# Patient Record
Sex: Female | Born: 1960 | Race: White | Hispanic: No | Marital: Married | Smoking: Never smoker
Health system: Southern US, Community
[De-identification: ages and names within clinical notes are randomized; demographics above are authoritative.]

## PROBLEM LIST (undated history)

## (undated) DIAGNOSIS — H8109 Meniere's disease, unspecified ear: Secondary | ICD-10-CM

## (undated) HISTORY — DX: Meniere's disease, unspecified ear: H81.09

---

## 2005-01-13 ENCOUNTER — Encounter: Admission: RE | Admit: 2005-01-13 | Discharge: 2005-01-13 | Payer: Self-pay | Admitting: *Deleted

## 2005-02-11 ENCOUNTER — Other Ambulatory Visit: Admission: RE | Admit: 2005-02-11 | Discharge: 2005-02-11 | Payer: Self-pay | Admitting: Family Medicine

## 2005-04-02 ENCOUNTER — Ambulatory Visit (HOSPITAL_COMMUNITY): Admission: RE | Admit: 2005-04-02 | Discharge: 2005-04-02 | Payer: Self-pay | Admitting: Family Medicine

## 2006-05-12 ENCOUNTER — Ambulatory Visit (HOSPITAL_COMMUNITY): Admission: RE | Admit: 2006-05-12 | Discharge: 2006-05-12 | Payer: Self-pay | Admitting: Family Medicine

## 2006-09-26 ENCOUNTER — Other Ambulatory Visit: Admission: RE | Admit: 2006-09-26 | Discharge: 2006-09-26 | Payer: Self-pay | Admitting: Family Medicine

## 2007-05-19 ENCOUNTER — Ambulatory Visit (HOSPITAL_COMMUNITY): Admission: RE | Admit: 2007-05-19 | Discharge: 2007-05-19 | Payer: Self-pay | Admitting: Family Medicine

## 2007-10-09 ENCOUNTER — Other Ambulatory Visit: Admission: RE | Admit: 2007-10-09 | Discharge: 2007-10-09 | Payer: Self-pay | Admitting: Family Medicine

## 2008-08-17 ENCOUNTER — Emergency Department (HOSPITAL_BASED_OUTPATIENT_CLINIC_OR_DEPARTMENT_OTHER): Admission: EM | Admit: 2008-08-17 | Discharge: 2008-08-17 | Payer: Self-pay | Admitting: Emergency Medicine

## 2009-03-31 ENCOUNTER — Ambulatory Visit (HOSPITAL_COMMUNITY): Admission: RE | Admit: 2009-03-31 | Discharge: 2009-03-31 | Payer: Self-pay | Admitting: Family Medicine

## 2009-04-11 ENCOUNTER — Other Ambulatory Visit: Admission: RE | Admit: 2009-04-11 | Discharge: 2009-04-11 | Payer: Self-pay | Admitting: Family Medicine

## 2010-04-12 ENCOUNTER — Ambulatory Visit (HOSPITAL_COMMUNITY): Admission: RE | Admit: 2010-04-12 | Discharge: 2010-04-12 | Payer: Self-pay | Admitting: Family Medicine

## 2010-10-28 ENCOUNTER — Encounter: Payer: Self-pay | Admitting: Family Medicine

## 2011-04-04 ENCOUNTER — Other Ambulatory Visit (HOSPITAL_COMMUNITY): Payer: Self-pay | Admitting: Family Medicine

## 2011-04-04 DIAGNOSIS — Z1231 Encounter for screening mammogram for malignant neoplasm of breast: Secondary | ICD-10-CM

## 2011-04-22 ENCOUNTER — Ambulatory Visit (HOSPITAL_COMMUNITY): Payer: BC Managed Care – PPO

## 2011-04-26 ENCOUNTER — Ambulatory Visit (HOSPITAL_COMMUNITY)
Admission: RE | Admit: 2011-04-26 | Discharge: 2011-04-26 | Disposition: A | Discharge status: Home / Self Care | Payer: BC Managed Care – PPO | Source: Ambulatory Visit | Attending: Family Medicine | Admitting: Family Medicine

## 2011-04-26 DIAGNOSIS — Z1231 Encounter for screening mammogram for malignant neoplasm of breast: Secondary | ICD-10-CM

## 2011-07-09 LAB — CBC
HCT: 40.8
MCHC: 34.4
Platelets: 181
RDW: 12.3

## 2011-07-09 LAB — DIFFERENTIAL
Basophils Relative: 1
Lymphocytes Relative: 34

## 2011-07-09 LAB — COMPREHENSIVE METABOLIC PANEL
AST: 32
Albumin: 4.9
Alkaline Phosphatase: 63
BUN: 9
CO2: 28
Calcium: 9.8
Chloride: 102
Creatinine, Ser: 0.7
Glucose, Bld: 117 — ABNORMAL HIGH
Total Bilirubin: 0.7

## 2011-07-09 LAB — D-DIMER, QUANTITATIVE: D-Dimer, Quant: 0.26

## 2011-07-09 LAB — POCT CARDIAC MARKERS
CKMB, poc: <1 — ABNORMAL LOW
CKMB, poc: <1 — ABNORMAL LOW
Myoglobin, poc: 59.5
Troponin i, poc: <0.05
Troponin i, poc: <0.05

## 2012-05-04 ENCOUNTER — Other Ambulatory Visit (HOSPITAL_COMMUNITY): Payer: Self-pay | Admitting: Family Medicine

## 2012-05-04 DIAGNOSIS — Z1231 Encounter for screening mammogram for malignant neoplasm of breast: Secondary | ICD-10-CM

## 2012-05-22 ENCOUNTER — Ambulatory Visit (HOSPITAL_COMMUNITY): Payer: BC Managed Care – PPO

## 2013-09-16 ENCOUNTER — Other Ambulatory Visit (HOSPITAL_COMMUNITY): Payer: Self-pay | Admitting: Family Medicine

## 2013-09-16 DIAGNOSIS — Z1231 Encounter for screening mammogram for malignant neoplasm of breast: Secondary | ICD-10-CM

## 2013-10-06 ENCOUNTER — Ambulatory Visit (HOSPITAL_COMMUNITY): Payer: BC Managed Care – PPO

## 2013-10-21 ENCOUNTER — Ambulatory Visit (HOSPITAL_COMMUNITY): Payer: BC Managed Care – PPO

## 2013-12-21 DIAGNOSIS — H903 Sensorineural hearing loss, bilateral: Secondary | ICD-10-CM | POA: Insufficient documentation

## 2013-12-21 DIAGNOSIS — H8109 Meniere's disease, unspecified ear: Secondary | ICD-10-CM | POA: Insufficient documentation

## 2014-02-16 ENCOUNTER — Ambulatory Visit (HOSPITAL_COMMUNITY): Payer: BC Managed Care – PPO

## 2014-03-29 ENCOUNTER — Ambulatory Visit (HOSPITAL_COMMUNITY)
Admission: RE | Admit: 2014-03-29 | Discharge: 2014-03-29 | Disposition: A | Discharge status: Home / Self Care | Payer: BC Managed Care – PPO | Source: Ambulatory Visit | Attending: Family Medicine | Admitting: Family Medicine

## 2014-03-29 ENCOUNTER — Other Ambulatory Visit (HOSPITAL_COMMUNITY): Payer: Self-pay | Admitting: Family Medicine

## 2014-03-29 DIAGNOSIS — Z1231 Encounter for screening mammogram for malignant neoplasm of breast: Secondary | ICD-10-CM

## 2015-05-22 ENCOUNTER — Other Ambulatory Visit (HOSPITAL_COMMUNITY): Payer: Self-pay | Admitting: Diagnostic Radiology

## 2015-05-22 DIAGNOSIS — Z1231 Encounter for screening mammogram for malignant neoplasm of breast: Secondary | ICD-10-CM

## 2015-05-25 ENCOUNTER — Ambulatory Visit (HOSPITAL_COMMUNITY): Payer: BC Managed Care – PPO

## 2015-06-09 ENCOUNTER — Ambulatory Visit (HOSPITAL_COMMUNITY): Payer: BC Managed Care – PPO

## 2016-04-04 ENCOUNTER — Other Ambulatory Visit: Payer: Self-pay | Admitting: Family Medicine

## 2016-04-04 ENCOUNTER — Ambulatory Visit
Admission: RE | Admit: 2016-04-04 | Discharge: 2016-04-04 | Disposition: A | Discharge status: Home / Self Care | Payer: BC Managed Care – PPO | Source: Ambulatory Visit | Attending: Diagnostic Radiology | Admitting: Diagnostic Radiology

## 2016-04-04 DIAGNOSIS — Z1231 Encounter for screening mammogram for malignant neoplasm of breast: Secondary | ICD-10-CM

## 2016-05-20 DIAGNOSIS — R430 Anosmia: Secondary | ICD-10-CM | POA: Insufficient documentation

## 2017-04-03 ENCOUNTER — Other Ambulatory Visit: Payer: Self-pay | Admitting: Family Medicine

## 2017-04-03 DIAGNOSIS — Z1231 Encounter for screening mammogram for malignant neoplasm of breast: Secondary | ICD-10-CM

## 2017-04-29 ENCOUNTER — Ambulatory Visit
Admission: RE | Admit: 2017-04-29 | Discharge: 2017-04-29 | Disposition: A | Discharge status: Home / Self Care | Payer: BC Managed Care – PPO | Source: Ambulatory Visit | Attending: Family Medicine | Admitting: Family Medicine

## 2017-04-29 DIAGNOSIS — Z1231 Encounter for screening mammogram for malignant neoplasm of breast: Secondary | ICD-10-CM

## 2018-03-31 ENCOUNTER — Other Ambulatory Visit: Payer: Self-pay | Admitting: Family Medicine

## 2018-03-31 DIAGNOSIS — Z1231 Encounter for screening mammogram for malignant neoplasm of breast: Secondary | ICD-10-CM

## 2018-05-04 ENCOUNTER — Ambulatory Visit: Payer: BC Managed Care – PPO

## 2018-05-25 ENCOUNTER — Ambulatory Visit: Payer: BC Managed Care – PPO

## 2018-06-23 ENCOUNTER — Ambulatory Visit
Admission: RE | Admit: 2018-06-23 | Discharge: 2018-06-23 | Disposition: A | Discharge status: Home / Self Care | Payer: BC Managed Care – PPO | Source: Ambulatory Visit | Attending: Family Medicine | Admitting: Family Medicine

## 2018-06-23 DIAGNOSIS — Z1231 Encounter for screening mammogram for malignant neoplasm of breast: Secondary | ICD-10-CM

## 2019-06-06 ENCOUNTER — Other Ambulatory Visit: Payer: Self-pay

## 2019-06-06 ENCOUNTER — Emergency Department (INDEPENDENT_AMBULATORY_CARE_PROVIDER_SITE_OTHER)
Admission: EM | Admit: 2019-06-06 | Discharge: 2019-06-06 | Disposition: A | Discharge status: Home / Self Care | Payer: BC Managed Care – PPO | Source: Home / Self Care | Attending: Family Medicine | Admitting: Family Medicine

## 2019-06-06 ENCOUNTER — Emergency Department (INDEPENDENT_AMBULATORY_CARE_PROVIDER_SITE_OTHER): Payer: BC Managed Care – PPO

## 2019-06-06 DIAGNOSIS — M79671 Pain in right foot: Secondary | ICD-10-CM

## 2019-06-06 DIAGNOSIS — S93509A Unspecified sprain of unspecified toe(s), initial encounter: Secondary | ICD-10-CM

## 2019-06-06 NOTE — ED Provider Notes (Signed)
[facility]    CSN: [id number] Arrival date & time: 06/06/19  1359      History   Chief Complaint Chief Complaint  Patient presents with  . Toe Pain    Injury; RT second toe    HPI Maat B Ivins is a 58 y.o. female.   Patient tripped over her "flip flop" last night, injuring her right second toe.  She has pain/swelling at the tip of her toe and pain when walking.  The history is provided by the patient.  Toe Pain This is a new problem. The current episode started yesterday. The problem occurs constantly. The problem has not changed since onset.The symptoms are aggravated by walking. Nothing relieves the symptoms. She has tried nothing for the symptoms.    History reviewed. No pertinent past medical history.  There are no active problems to display for this patient.   History reviewed. No pertinent surgical history.  OB History   No obstetric history on file.      Home Medications    Prior to Admission medications   Medication Sig Start Date End Date Taking? Authorizing Provider  sertraline (ZOLOFT) 100 MG tablet TK 1 T QD 05/20/19   [provider]    Family History Family History  Problem Relation Age of Onset  . Breast cancer Sister   . Breast cancer Maternal Grandmother     Social History Social History   Tobacco Use  . Smoking status: Never Smoker  . Smokeless tobacco: Never Used  Substance Use Topics  . Alcohol use: Never    Frequency: Never  . Drug use: Not on file     Allergies   Patient has no known allergies.   Review of Systems Review of Systems  Musculoskeletal:       Right second toe pain  All other systems reviewed and are negative.    Physical Exam Triage Vital Signs ED Triage Vitals  Enc Vitals Group     BP 06/06/19 1413 (!) 160/102     Pulse Rate 06/06/19 1413 94     Resp 06/06/19 1413 18     Temp 06/06/19 1413 98.2 F (36.8 C)     Temp Source 06/06/19 1413 Oral     SpO2 06/06/19 1413 97 %     Weight 06/06/19 1414 150 lb (68 kg)     Height 06/06/19 1414 5\' 2"  (1.575 m)     Head Circumference --      Peak Flow --      Pain Score 06/06/19 1414 7     Pain Loc --      Pain Edu? --      Excl. in [place]? --    No data found.  Updated Vital Signs BP (!) 160/102 (BP Location: Right Arm)   Pulse 94   Temp 98.2 F (36.8 C) (Oral)   Resp 18   Ht 5\' 2"  (1.575 m)   Wt 68 kg   SpO2 97%   BMI 27.44 kg/m   Visual Acuity Right Eye Distance:   Left Eye Distance:   Bilateral Distance:    Right Eye Near:   Left Eye Near:    Bilateral Near:     Physical Exam Vitals signs reviewed.  Constitutional:      General: She is not in acute distress. HENT:     Head: Atraumatic.  Eyes:     Pupils: Pupils are equal, round, and reactive to light.  Cardiovascular:     Rate  and Rhythm: Normal rate.  Pulmonary:     Effort: Pulmonary effort is normal.  Musculoskeletal:     Right foot: Decreased range of motion. Tenderness, bony tenderness and swelling present.       Feet:     Comments: Tenderness, swelling, ecchymosis distal phalanx right second toe.  Skin:    General: Skin is warm and dry.  Neurological:     Mental Status: She is alert.      UC Treatments / Results  Labs (all labs ordered are listed, but only abnormal results are displayed) Labs Reviewed - No data to display  EKG   Radiology Dg Foot Complete Right  Result Date: 06/06/2019 CLINICAL DATA:  Second toe injury after tripping last night. EXAM: RIGHT FOOT COMPLETE - 3+ VIEW COMPARISON:  None. FINDINGS: There is no evidence of fracture or dislocation. There is no evidence of arthropathy or other focal bone abnormality. Soft tissues are unremarkable. IMPRESSION: Negative. Electronically Signed   By: Obie DredgeWilliam T Derry M.D.   On: 06/06/2019 15:09    Procedures Procedures (including critical care time)  Medications Ordered in UC Medications - No data to display  Initial Impression / Assessment and Plan / UC Course   I have reviewed the triage vital signs and the nursing notes.  Pertinent labs & imaging results that were available during my care of the patient were reviewed by me and considered in my medical decision making (see chart for details).     Toe strapped using "Buddy Tape" technique.  Followup with Dr. Rodney Langtonhomas Thekkekandam or Dr. Clementeen GrahamEvan Corey ([facility]) if not improving about two weeks.    Final Clinical Impressions(s) / UC Diagnoses   Final diagnoses:  Toe sprain, initial encounter     Discharge Instructions     "Buddy Tape" toes.  Apply ice pack for 10 to 15 minutes, 3 to 4 times daily  Continue until pain and swelling decrease.  Wear comfortable open toe sandals. May take ibuprofen as needed.    ED Prescriptions    None        Lattie HawBeese,  A, MD 06/07/19 [phone]

## 2019-06-06 NOTE — Discharge Instructions (Addendum)
"  Buddy Tape" toes.  Apply ice pack for 10 to 15 minutes, 3 to 4 times daily  Continue until pain and swelling decrease.  Wear comfortable open toe sandals. May take ibuprofen as needed.

## 2019-06-06 NOTE — ED Triage Notes (Signed)
Pt c/o toe pain (RT second toe). She states she injured it when she tripped over her flip flop last night. Bruising noted. Pain 7/10. No OTC pain meds taken today.

## 2020-03-23 ENCOUNTER — Other Ambulatory Visit: Payer: Self-pay | Admitting: Family Medicine

## 2020-03-23 DIAGNOSIS — Z1231 Encounter for screening mammogram for malignant neoplasm of breast: Secondary | ICD-10-CM

## 2020-03-29 ENCOUNTER — Ambulatory Visit
Admission: RE | Admit: 2020-03-29 | Discharge: 2020-03-29 | Disposition: A | Discharge status: Home / Self Care | Payer: BC Managed Care – PPO | Source: Ambulatory Visit | Attending: Family Medicine | Admitting: Family Medicine

## 2020-03-29 ENCOUNTER — Other Ambulatory Visit: Payer: Self-pay

## 2020-03-29 DIAGNOSIS — Z1231 Encounter for screening mammogram for malignant neoplasm of breast: Secondary | ICD-10-CM

## 2020-03-30 ENCOUNTER — Other Ambulatory Visit: Payer: Self-pay | Admitting: *Deleted

## 2020-04-15 NOTE — Progress Notes (Signed)
Cardiology Office Note:    Date:  04/17/2020   ID:  Lori Sparks, DOB [birth year], MRN [id number]  PCP:  Joycelyn Rua, MD  Cardiologist:  Norman Herrlich, MD   Referring MD: Joycelyn Rua, MD  ASSESSMENT:    1. Palpitations   2. Chest pain of uncertain etiology   3. Essential hypertension    PLAN:    In order of problems listed above:  1. Her palpitation is progressive quite suggestive of atrial fibrillation and after discussion of options we will arm her with a live ZIO AT monitor and hopefully document atrial fibrillation.  If documented she would require anticoagulation with female sex and hypertension and with the severity of her symptoms I would give her the option of antiarrhythmic drug flecainide or referral for EP catheter ablation. 2. She is having symptoms best describe his atypical angina the question is whether it is due to arrhythmia or a second cardiac problem with her significant hyperlipidemia and should undergo cardiac CTA continue beta-blocker.  If she had high risk markers we need to consider the merits of coronary angiography. 3. Continue her beta-blocker if she requires a second drug ideas low-dose ARB 4. Initiate lipid-lowering treatment with a high intensity statin  Next appointment in 6 weeks   Medication Adjustments/Labs and Tests Ordered: Current medicines are reviewed at length with the patient today.  Concerns regarding medicines are outlined above.  Orders Placed This Encounter  Procedures  . CT CORONARY MORPH W/CTA COR W/SCORE W/CA W/CM &/OR WO/CM  . CT CORONARY FRACTIONAL FLOW RESERVE DATA PREP  . CT CORONARY FRACTIONAL FLOW RESERVE FLUID ANALYSIS  . Basic metabolic panel  . LONG TERM MONITOR-LIVE TELEMETRY (3-14 DAYS)  . EKG 12-Lead   Meds ordered this encounter  Medications  . rosuvastatin (CRESTOR) 10 MG tablet    Sig: Take 1 tablet (10 mg total) by mouth daily.    Dispense:  90 tablet    Refill:  3     Chief Complaint    Patient presents with  . Palpitations  . Chest Pain    History of Present Illness:    Lori Sparks is a 59 y.o. female who is being seen today for the evaluation of palpitation and chest pain at the request of Joycelyn Rua, MD. 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  May the symptoms markedly progressed and she started to awaken at night with her heart racing and it would last at times for hours.  In the last month and now occurs during the daytime and it tends to be persistent.  She is also noticed in the last month that she gets episodes of chest discomfort.  She describes it as tightness substernal without activity but at times relieved with rest and associated with her palpitation.  There is no radiation.  And the symptoms are for been brief less than 10 to 15 minutes.  She has had some shortness of breath when she tries to do exercise.  Her family history is noteworthy for atrial fibrillation in her mother in her 30s and CAD in her father MI 62 year old and a sister who has had stroke in her 37s.  She has no known history of congenital rheumatic heart disease.  Unfortunately she has not documented any episodes to measure her heart rate.  In the last year she has had multiple  determinations of elevated blood pressure recently started on a beta-blocker and has had no further chest pain since then and has less severe palpitation.  She also has quite significant dyslipidemia with an LDL cholesterol of 172 total 308 HDL 129 A1c not measured hemoglobin 13.4 TSH normal.  Past Medical History:  Diagnosis Date  . Meniere disease     History reviewed. No pertinent surgical history.  Current Medications: Current Meds  Medication Sig  . ALPRAZolam (XANAX) 0.5 MG tablet Take 0.5 mg by mouth 3 (three) times daily as needed.  .  metoprolol succinate (TOPROL-XL) 25 MG 24 hr tablet Take 25 mg by mouth daily.     Allergies:   Patient has no known allergies.   Social History   Socioeconomic History  . Marital status: Married    Spouse name: Not on file  . Number of children: Not on file  . Years of education: Not on file  . Highest education level: Not on file  Occupational History  . Occupation: Runner, broadcasting/film/video  Tobacco Use  . Smoking status: Never Smoker  . Smokeless tobacco: Never Used  Vaping Use  . Vaping Use: Never used  Substance and Sexual Activity  . Alcohol use: Never  . Drug use: Not on file  . Sexual activity: Not on file  Other Topics Concern  . Not on file  Social History Narrative  . Not on file   Social Determinants of Health   Financial Resource Strain:   . Difficulty of Paying Living Expenses:   Food Insecurity:   . Worried About Programme researcher, broadcasting/film/video in the Last Year:   . Barista in the Last Year:   Transportation Needs:   . Freight forwarder (Medical):   Marland Kitchen Lack of Transportation (Non-Medical):   Physical Activity:   . Days of Exercise per Week:   . Minutes of Exercise per Session:   Stress:   . Feeling of Stress :   Social Connections:   . Frequency of Communication with Friends and Family:   . Frequency of Social Gatherings with Friends and Family:   . Attends Religious Services:   . Active Member of Clubs or Organizations:   . Attends Banker Meetings:   Marland Kitchen Marital Status:      Family History: The patient's family history includes Breast cancer in her maternal grandmother and sister.  ROS:   Review of Systems  Constitutional: Negative.  HENT: Negative.   Eyes: Negative.   Cardiovascular: Positive for chest pain and palpitations.  Respiratory: Positive for shortness of breath.   Endocrine: Negative.   Hematologic/Lymphatic: Negative.   Skin: Negative.   Musculoskeletal: Negative.   Gastrointestinal: Negative.   Genitourinary: Negative.     Neurological: Negative.   Psychiatric/Behavioral: Negative.   Allergic/Immunologic: Negative.    Please see the history of present illness.     All other systems reviewed and are negative.  EKGs/Labs/Other Studies Reviewed:    The following studies were reviewed today:   EKG:  EKG is  ordered today.  The ekg ordered today is personally reviewed and demonstrates sinus rhythm normal    Physical Exam:    VS:  BP (!) 152/86   Pulse 84   Ht 5\' 2"  (1.575 m)   Wt 148 lb (67.1 kg)   SpO2 98%   BMI 27.07 kg/m     Wt Readings from Last 3 Encounters:  04/17/20 148 lb (67.1 kg)  06/06/19 150 lb (68 kg)  GEN:  Well nourished, well developed in no acute distress HEENT: Normal NECK: No JVD; No carotid bruits LYMPHATICS: No lymphadenopathy CARDIAC: RRR, no murmurs, rubs, gallops RESPIRATORY:  Clear to auscultation without rales, wheezing or rhonchi  ABDOMEN: Soft, non-tender, non-distended MUSCULOSKELETAL:  No edema; No deformity  SKIN: Warm and dry NEUROLOGIC:  Alert and oriented x 3 PSYCHIATRIC:  Normal affect     Signed, Norman Herrlich, MD  04/17/2020 12:02 PM    [facility]

## 2020-04-17 ENCOUNTER — Telehealth: Payer: Self-pay | Admitting: Cardiology

## 2020-04-17 ENCOUNTER — Ambulatory Visit (INDEPENDENT_AMBULATORY_CARE_PROVIDER_SITE_OTHER): Payer: BC Managed Care – PPO | Admitting: Cardiology

## 2020-04-17 ENCOUNTER — Other Ambulatory Visit: Payer: Self-pay

## 2020-04-17 ENCOUNTER — Ambulatory Visit: Payer: BC Managed Care – PPO

## 2020-04-17 ENCOUNTER — Ambulatory Visit (INDEPENDENT_AMBULATORY_CARE_PROVIDER_SITE_OTHER): Payer: BC Managed Care – PPO

## 2020-04-17 ENCOUNTER — Encounter: Payer: Self-pay | Admitting: Cardiology

## 2020-04-17 VITALS — BP 152/86 | HR 84 | Ht 62.0 in | Wt 148.0 lb

## 2020-04-17 DIAGNOSIS — R002 Palpitations: Secondary | ICD-10-CM

## 2020-04-17 DIAGNOSIS — E7800 Pure hypercholesterolemia, unspecified: Secondary | ICD-10-CM | POA: Diagnosis not present

## 2020-04-17 DIAGNOSIS — I10 Essential (primary) hypertension: Secondary | ICD-10-CM

## 2020-04-17 DIAGNOSIS — R079 Chest pain, unspecified: Secondary | ICD-10-CM

## 2020-04-17 MED ORDER — ROSUVASTATIN CALCIUM 10 MG PO TABS: 10.0000 mg | ORAL_TABLET | Freq: Every day | ORAL | 3 refills | Status: DC

## 2020-04-17 NOTE — Telephone Encounter (Signed)
Patient states she has additional questions in regards to heart monitor discussed during appointment on today, 04/17/20 with Dr. Dulce Sellar. Please call.

## 2020-04-17 NOTE — Telephone Encounter (Signed)
Spoke to the patient just now and answered her questions in regards to her Live zio monitor. She was wondering if Dr. Dulce Sellar could see the monitor reading at all times. I let her know that Dr. Dulce Sellar would be notified if there were any kind of alarming events but he would not be reading the strip at all times. She verbalizes understanding and thanks me for the call back.   Encouraged patient to call back with any questions or concerns.

## 2020-04-17 NOTE — Patient Instructions (Addendum)
Medication Instructions:  Your physician has recommended you make the following change in your medication:  START: Rosuvastatin 10 mg take one tablet by mouth daily.  *If you need a refill on your cardiac medications before your next appointment, please call your pharmacy*   Lab Work: Your physician recommends that you return for lab work in: Within one week of your cardiac CT If you have labs (blood work) drawn today and your tests are completely normal, you will receive your results only by: Marland Kitchen MyChart Message (if you have MyChart) OR . A paper copy in the mail If you have any lab test that is abnormal or we need to change your treatment, we will call you to review the results.   Testing/Procedures: A zio monitor was ordered today. It will remain on for 30 days. You will then return monitor and event diary in provided box. It takes 1-2 weeks for report to be downloaded and returned to [place]. We will call you with the results. If monitor falls off or has orange flashing light, please call Zio for further instructions.   Your cardiac CT will be scheduled at the below location:   [facility] [address] [place], [place] [address] [phone]  If scheduled at [facility], please arrive at the [facility] main entrance of [facility] 30 minutes prior to test start time. Proceed to the [facility] Radiology Department (first floor) to check-in and test prep.  Please follow these instructions carefully (unless otherwise directed):  On the Night Before the Test: . Be sure to Drink plenty of water. . Do not consume any caffeinated/decaffeinated beverages or chocolate 12 hours prior to your test. . Do not take any antihistamines 12 hours prior to your test.  On the Day of the Test: . Drink plenty of water. Do not drink any water within one hour of the test. . Do not eat any food 4 hours prior to the test. . You may take your regular medications prior to the  test.  . Take metoprolol (Lopressor) two hours prior to test. . FEMALES- please wear underwire-free bra if available       After the Test: . Drink plenty of water. . After receiving IV contrast, you may experience a mild flushed feeling. This is normal. . On occasion, you may experience a mild rash up to 24 hours after the test. This is not dangerous. If this occurs, you can take Benadryl 25 mg and increase your fluid intake. . If you experience trouble breathing, this can be serious. If it is severe call 911 IMMEDIATELY. If it is mild, please call our office. . If you take any of these medications: Glipizide/Metformin, Avandament, Glucavance, please do not take 48 hours after completing test unless otherwise instructed.   Once we have confirmed authorization from your insurance company, we will call you to set up a date and time for your test. Based on how quickly your insurance processes prior authorizations requests, please allow up to 4 weeks to be contacted for scheduling your Cardiac CT appointment. Be advised that routine Cardiac CT appointments could be scheduled as many as 8 weeks after your provider has ordered it.  For non-scheduling related questions, please contact the cardiac imaging nurse navigator should you have any questions/concerns: Marchia Bond, Cardiac Imaging Nurse Navigator Burley Saver, Interim Cardiac Imaging Nurse [place] [facility] Direct Office Dial: [phone]   For scheduling needs, including cancellations and rescheduling,  please call Vivien Rota at [phone], option 3.        Follow-Up: At [facility], you and your health needs are our priority.  As part of our continuing mission to provide you with exceptional heart care, we have created designated Provider Care Teams.  These Care Teams include your primary Cardiologist (physician) and Advanced Practice Providers (APPs -  Physician Assistants and Nurse Practitioners) who all  work together to provide you with the care you need, when you need it.  We recommend signing up for the patient portal called "MyChart".  Sign up information is provided on this After Visit Summary.  MyChart is used to connect with patients for Virtual Visits (Telemedicine).  Patients are able to view lab/test results, encounter notes, upcoming appointments, etc.  Non-urgent messages can be sent to your provider as well.   To learn more about what you can do with MyChart, go to [email or web address].    Your next appointment:   6 week(s)  The format for your next appointment:   In Person  Provider:   Shirlee More, MD   Other Instructions

## 2020-04-25 ENCOUNTER — Other Ambulatory Visit: Payer: Self-pay | Admitting: Cardiology

## 2020-04-25 MED ORDER — METOPROLOL SUCCINATE ER 25 MG PO TB24: 25.0000 mg | ORAL_TABLET | Freq: Every day | ORAL | 3 refills | Status: DC

## 2020-04-25 NOTE — Telephone Encounter (Signed)
*  STAT* If patient is at the pharmacy, call can be transferred to refill team.   1. Which medications need to be refilled? (please list name of each medication and dose if known)   metoprolol succinate (TOPROL-XL) 25 MG 24 hr tablet     2. Which pharmacy/location (including street and city if local pharmacy) is medication to be sent to? [id number] - [place], [place] - [address]  3. Do they need a 30 day or 90 day supply? 90 day supply   

## 2020-04-25 NOTE — Telephone Encounter (Signed)
Refill sent in per request.  

## 2020-05-22 ENCOUNTER — Telehealth: Payer: Self-pay

## 2020-05-22 NOTE — Telephone Encounter (Signed)
Left message on patients voicemail to please return our call.   

## 2020-05-22 NOTE — Telephone Encounter (Signed)
-----   Message from Baldo Daub, MD sent at 05/20/2020 12:49 PM EDT ----- Normal or stable result  Monitor is good the episode she is aware of her heart are not related to an irregular heartbeat and this is good results

## 2020-06-18 NOTE — Progress Notes (Signed)
Cardiology Office Note:    Date:  06/19/2020   ID:  Lori Sparks, DOB 09-04-1961, MRN [id number]  PCP:  Lori Rua, MD  Cardiologist:  Lori Herrlich, MD    Referring MD: Lori Rua, MD    ASSESSMENT:    1. Palpitations   2. Chest pain of uncertain etiology   3. Essential hypertension   4. Pure hypercholesterolemia   5. Educated about COVID-19 virus infection    PLAN:    In order of problems listed above:  1. Resolved continue beta-blocker and if additional therapy is needed for hypertension we could transition to Bystolic. 2. I do not know why her CTA was not scheduled but will be performed in view of her chest pain elevators lipids 3. Initiate her statin she need a lipid profile next visit 4. She had COVID-19 previously strongly encouraged her to accept vaccine.  It appears if 1 dose is adequate in people with previous clinical COVID-19 infection documented   Next appointment: 6 weeks   Medication Adjustments/Labs and Tests Ordered: Current medicines are reviewed at length with the patient today.  Concerns regarding medicines are outlined above.  No orders of the defined types were placed in this encounter.  No orders of the defined types were placed in this encounter.   Chief Complaint  Patient presents with  . Palpitations    History of Present Illness:    Lori Sparks is a 59 y.o. female with a hx of palpitation and chest pain  last seen 04/17/2020.  Compliance with diet, lifestyle and medications: Yes  ZIO monitor for 14 days showing rare ventricular and supraventricular ectopy.  The triggered and diary events were predominantly associated with sinus rhythm.  Overall she is done well she has had no recurrent palpitation is reassured by the results of her monitor. Unfortunately she never started her statin. Home blood pressures generally less than 140 systolic however she is significantly hypertensive in the office repeat by me  154/94 and will get a discharge home blood pressures for 2 to 3 weeks and may require transition from metoprolol to Bystolic or addition of an ARB. She agrees to undergo cardiac CTA and will be scheduled in the office she continues to have intermittent nonexertional chest pain. Past Medical History:  Diagnosis Date  . Meniere disease     Past Surgical History:  Procedure Laterality Date  . CESAREAN SECTION      Current Medications: Current Meds  Medication Sig  . ALPRAZolam (XANAX) 0.5 MG tablet Take 0.5 mg by mouth as needed.   . metoprolol succinate (TOPROL-XL) 25 MG 24 hr tablet Take 1 tablet (25 mg total) by mouth daily.  . rosuvastatin (CRESTOR) 10 MG tablet Take 1 tablet (10 mg total) by mouth daily.  . sertraline (ZOLOFT) 100 MG tablet Take 100 mg by mouth daily.     Allergies:   Patient has no known allergies.   Social History   Socioeconomic History  . Marital status: Married    Spouse name: Not on file  . Number of children: Not on file  . Years of education: Not on file  . Highest education level: Not on file  Occupational History  . Occupation: Runner, broadcasting/film/video  Tobacco Use  . Smoking status: Never Smoker  . Smokeless tobacco: Never Used  Vaping Use  . Vaping Use: Never used  Substance and Sexual Activity  . Alcohol use: Never  . Drug use: Not on file  . Sexual activity: Not on file  Other Topics Concern  . Not on file  Social History Narrative  . Not on file   Social Determinants of Health   Financial Resource Strain:   . Difficulty of Paying Living Expenses: Not on file  Food Insecurity:   . Worried About Programme researcher, broadcasting/film/video in the Last Year: Not on file  . Ran Out of Food in the Last Year: Not on file  Transportation Needs:   . Lack of Transportation (Medical): Not on file  . Lack of Transportation (Non-Medical): Not on file  Physical Activity:   . Days of Exercise per Week: Not on file  . Minutes of Exercise per Session: Not on file  Stress:   .  Feeling of Stress : Not on file  Social Connections:   . Frequency of Communication with Friends and Family: Not on file  . Frequency of Social Gatherings with Friends and Family: Not on file  . Attends Religious Services: Not on file  . Active Member of Clubs or Organizations: Not on file  . Attends Banker Meetings: Not on file  . Marital Status: Not on file     Family History: The patient's family history includes Breast cancer in her maternal grandmother and sister. ROS:   Please see the history of present illness.    All other systems reviewed and are negative.  EKGs/Labs/Other Studies Reviewed:    The following studies were reviewed today  Recent Labs: 11/10/2019: Cholesterol 308 LDL severely elevated 172 HDL 129  Physical Exam:    VS:  BP (!) 157/94   Pulse 79   Ht 5\' 2"  (1.575 m)   Wt 150 lb 0.6 oz (68.1 kg)   SpO2 98%   BMI 27.44 kg/m     Wt Readings from Last 3 Encounters:  06/19/20 150 lb 0.6 oz (68.1 kg)  04/17/20 148 lb (67.1 kg)  06/06/19 150 lb (68 kg)     GEN:  Well nourished, well developed in no acute distress HEENT: Normal NECK: No JVD; No carotid bruits LYMPHATICS: No lymphadenopathy CARDIAC: RRR, no murmurs, rubs, gallops RESPIRATORY:  Clear to auscultation without rales, wheezing or rhonchi  ABDOMEN: Soft, non-tender, non-distended MUSCULOSKELETAL:  No edema; No deformity  SKIN: Warm and dry NEUROLOGIC:  Alert and oriented x 3 PSYCHIATRIC:  Normal affect    Signed, 06/08/19, MD  06/19/2020 12:07 PM    [facility]

## 2020-06-19 ENCOUNTER — Other Ambulatory Visit: Payer: Self-pay

## 2020-06-19 ENCOUNTER — Encounter: Payer: Self-pay | Admitting: Cardiology

## 2020-06-19 ENCOUNTER — Ambulatory Visit: Payer: BC Managed Care – PPO | Admitting: Cardiology

## 2020-06-19 VITALS — BP 157/94 | HR 79 | Ht 62.0 in | Wt 150.0 lb

## 2020-06-19 DIAGNOSIS — E7800 Pure hypercholesterolemia, unspecified: Secondary | ICD-10-CM | POA: Diagnosis not present

## 2020-06-19 DIAGNOSIS — R002 Palpitations: Secondary | ICD-10-CM | POA: Diagnosis not present

## 2020-06-19 DIAGNOSIS — Z7189 Other specified counseling: Secondary | ICD-10-CM

## 2020-06-19 DIAGNOSIS — R079 Chest pain, unspecified: Secondary | ICD-10-CM | POA: Diagnosis not present

## 2020-06-19 DIAGNOSIS — I10 Essential (primary) hypertension: Secondary | ICD-10-CM

## 2020-06-19 NOTE — Patient Instructions (Signed)
Medication Instructions:  Your physician recommends that you continue on your current medications as directed. Please refer to the Current Medication list given to you today.  *If you need a refill on your cardiac medications before your next appointment, please call your pharmacy*   Lab Work: None If you have labs (blood work) drawn today and your tests are completely normal, you will receive your results only by: Marland Kitchen MyChart Message (if you have MyChart) OR . A paper copy in the mail If you have any lab test that is abnormal or we need to change your treatment, we will call you to review the results.   Testing/Procedures: None   Follow-Up: At [facility], you and your health needs are our priority.  As part of our continuing mission to provide you with exceptional heart care, we have created designated Provider Care Teams.  These Care Teams include your primary Cardiologist (physician) and Advanced Practice Providers (APPs -  Physician Assistants and Nurse Practitioners) who all work together to provide you with the care you need, when you need it.  We recommend signing up for the patient portal called "MyChart".  Sign up information is provided on this After Visit Summary.  MyChart is used to connect with patients for Virtual Visits (Telemedicine).  Patients are able to view lab/test results, encounter notes, upcoming appointments, etc.  Non-urgent messages can be sent to your provider as well.   To learn more about what you can do with MyChart, go to [email or web address].    Your next appointment:   6 week(s)  The format for your next appointment:   In Person  Provider:   Norman Herrlich, MD   Other Instructions Please record your blood pressure in a sitting and standing position twice a week for three weeks.

## 2020-07-28 ENCOUNTER — Ambulatory Visit: Payer: BC Managed Care – PPO | Admitting: Cardiology

## 2020-08-08 ENCOUNTER — Telehealth (HOSPITAL_COMMUNITY): Payer: Self-pay | Admitting: Emergency Medicine

## 2020-08-08 NOTE — Telephone Encounter (Signed)
Attempted to call patient regarding upcoming cardiac CT appointment. °Left message on voicemail with name and callback number °Janus Vlcek RN Navigator Cardiac Imaging °[facility] °[phone] Office °[phone] Cell ° °

## 2020-08-08 NOTE — Telephone Encounter (Signed)
Reaching out to patient to offer assistance regarding upcoming cardiac imaging study; pt verbalizes understanding of appt date/time, parking situation and where to check in, pre-test NPO status and medications ordered, and verified current allergies; name and call back number provided for further questions should they arise Quint Chestnut RN Navigator Cardiac Imaging [facility] [phone] office [phone] cell 

## 2020-08-09 ENCOUNTER — Encounter (HOSPITAL_COMMUNITY): Payer: Self-pay

## 2020-08-09 ENCOUNTER — Ambulatory Visit (HOSPITAL_COMMUNITY)
Admission: RE | Admit: 2020-08-09 | Discharge: 2020-08-09 | Disposition: A | Discharge status: Home / Self Care | Payer: BC Managed Care – PPO | Source: Ambulatory Visit | Attending: Cardiology | Admitting: Cardiology

## 2020-08-09 ENCOUNTER — Other Ambulatory Visit: Payer: Self-pay

## 2020-08-09 DIAGNOSIS — R079 Chest pain, unspecified: Secondary | ICD-10-CM

## 2020-08-09 MED ORDER — METOPROLOL TARTRATE 5 MG/5ML IV SOLN: 5.0000 mg | INTRAVENOUS | Status: DC | PRN

## 2020-08-09 MED ORDER — NITROGLYCERIN 0.4 MG SL SUBL: 0.8000 mg | SUBLINGUAL_TABLET | Freq: Once | SUBLINGUAL | Status: DC

## 2020-08-14 DIAGNOSIS — H8109 Meniere's disease, unspecified ear: Secondary | ICD-10-CM | POA: Insufficient documentation

## 2020-08-15 NOTE — Progress Notes (Deleted)
Cardiology Office Note:    Date:  08/15/2020   ID:  Lori Sparks, DOB Dec 17, 1960, MRN [id number]  PCP:  Lori Rua, MD  Cardiologist:  Lori Herrlich, MD    Referring MD: Lori Rua, MD    ASSESSMENT:    No diagnosis found. PLAN:    In order of problems listed above:  1. ***   Next appointment: ***   Medication Adjustments/Labs and Tests Ordered: Current medicines are reviewed at length with the patient today.  Concerns regarding medicines are outlined above.  No orders of the defined types were placed in this encounter.  No orders of the defined types were placed in this encounter.   No chief complaint on file.   History of Present Illness:    Lori Sparks is a 59 y.o. female with a hx of palpitation and chest pain.  She was last seen 06/19/2020.  Her 14-day ZIO monitor showed rare ventricular and supraventricular ectopy and the triggered and diary events were predominantly associated with sinus rhythm. Compliance with diet, lifestyle and medications: Yes  Her concern today is that her blood pressure spikes in the context Past Medical History:  Diagnosis Date  . Meniere disease     Past Surgical History:  Procedure Laterality Date  . CESAREAN SECTION      Current Medications: No outpatient medications have been marked as taking for the 08/16/20 encounter (Appointment) with Lori Daub, MD.     Allergies:   Patient has no known allergies.   Social History   Socioeconomic History  . Marital status: Married    Spouse name: Not on file  . Number of children: Not on file  . Years of education: Not on file  . Highest education level: Not on file  Occupational History  . Occupation: Runner, broadcasting/film/video  Tobacco Use  . Smoking status: Never Smoker  . Smokeless tobacco: Never Used  Vaping Use  . Vaping Use: Never used  Substance and Sexual Activity  . Alcohol use: Never  . Drug use: Not on file  . Sexual activity: Not on file   Other Topics Concern  . Not on file  Social History Narrative  . Not on file   Social Determinants of Health   Financial Resource Strain:   . Difficulty of Paying Living Expenses: Not on file  Food Insecurity:   . Worried About Programme researcher, broadcasting/film/video in the Last Year: Not on file  . Ran Out of Food in the Last Year: Not on file  Transportation Needs:   . Lack of Transportation (Medical): Not on file  . Lack of Transportation (Non-Medical): Not on file  Physical Activity:   . Days of Exercise per Week: Not on file  . Minutes of Exercise per Session: Not on file  Stress:   . Feeling of Stress : Not on file  Social Connections:   . Frequency of Communication with Friends and Family: Not on file  . Frequency of Social Gatherings with Friends and Family: Not on file  . Attends Religious Services: Not on file  . Active Member of Clubs or Organizations: Not on file  . Attends Banker Meetings: Not on file  . Marital Status: Not on file     Family History: The patient's ***family history includes Breast cancer in her maternal grandmother and sister. ROS:   Please see the history of present illness.    All other systems reviewed and are negative.  EKGs/Labs/Other Studies Reviewed:  The following studies were reviewed today:  EKG:  EKG ordered today and personally reviewed.  The ekg ordered today demonstrates ***  Recent Labs: No results found for requested labs within last 8760 hours.  Recent Lipid Panel No results found for: CHOL, TRIG, HDL, CHOLHDL, VLDL, LDLCALC, LDLDIRECT  Physical Exam:    VS:  There were no vitals taken for this visit.    Wt Readings from Last 3 Encounters:  06/19/20 150 lb 0.6 oz (68.1 kg)  04/17/20 148 lb (67.1 kg)  06/06/19 150 lb (68 kg)     GEN: *** Well nourished, well developed in no acute distress HEENT: Normal NECK: No JVD; No carotid bruits LYMPHATICS: No lymphadenopathy CARDIAC: ***RRR, no murmurs, rubs,  gallops RESPIRATORY:  Clear to auscultation without rales, wheezing or rhonchi  ABDOMEN: Soft, non-tender, non-distended MUSCULOSKELETAL:  No edema; No deformity  SKIN: Warm and dry NEUROLOGIC:  Alert and oriented x 3 PSYCHIATRIC:  Normal affect    Signed, Lori Herrlich, MD  08/15/2020 1:47 PM    [facility]

## 2020-08-16 ENCOUNTER — Ambulatory Visit: Payer: BC Managed Care – PPO | Admitting: Cardiology

## 2020-09-17 NOTE — Progress Notes (Deleted)
Cardiology Office Note:    Date:  09/17/2020   ID:  Lori Sparks, DOB 02-15-1961, MRN [id number]  PCP:  Joycelyn Rua, MD  Cardiologist:  Norman Herrlich, MD    Referring MD: Joycelyn Rua, MD    ASSESSMENT:    No diagnosis found. PLAN:    In order of problems listed above:  1. ***   Next appointment: ***   Medication Adjustments/Labs and Tests Ordered: Current medicines are reviewed at length with the patient today.  Concerns regarding medicines are outlined above.  No orders of the defined types were placed in this encounter.  No orders of the defined types were placed in this encounter.   No chief complaint on file.   History of Present Illness:    Lori Sparks is a 59 y.o. female with a hx of palpitation hyperlipidemia and chest pain.  Last seen 06/19/2020. Compliance with diet, lifestyle and medications: *** Past Medical History:  Diagnosis Date  . Meniere disease     Past Surgical History:  Procedure Laterality Date  . CESAREAN SECTION      Current Medications: No outpatient medications have been marked as taking for the 09/18/20 encounter (Appointment) with Baldo Daub, MD.     Allergies:   Patient has no known allergies.   Social History   Socioeconomic History  . Marital status: Married    Spouse name: Not on file  . Number of children: Not on file  . Years of education: Not on file  . Highest education level: Not on file  Occupational History  . Occupation: Runner, broadcasting/film/video  Tobacco Use  . Smoking status: Never Smoker  . Smokeless tobacco: Never Used  Vaping Use  . Vaping Use: Never used  Substance and Sexual Activity  . Alcohol use: Never  . Drug use: Not on file  . Sexual activity: Not on file  Other Topics Concern  . Not on file  Social History Narrative  . Not on file   Social Determinants of Health   Financial Resource Strain: Not on file  Food Insecurity: Not on file  Transportation Needs: Not on file   Physical Activity: Not on file  Stress: Not on file  Social Connections: Not on file     Family History: The patient's ***family history includes Breast cancer in her maternal grandmother and sister. ROS:   Please see the history of present illness.    All other systems reviewed and are negative.  EKGs/Labs/Other Studies Reviewed:    The following studies were reviewed today:  EKG:  EKG ordered today and personally reviewed.  The ekg ordered today demonstrates ***  Recent Labs: No results found for requested labs within last 8760 hours.  Recent Lipid Panel No results found for: CHOL, TRIG, HDL, CHOLHDL, VLDL, LDLCALC, LDLDIRECT  Physical Exam:    VS:  There were no vitals taken for this visit.    Wt Readings from Last 3 Encounters:  06/19/20 150 lb 0.6 oz (68.1 kg)  04/17/20 148 lb (67.1 kg)  06/06/19 150 lb (68 kg)     GEN: *** Well nourished, well developed in no acute distress HEENT: Normal NECK: No JVD; No carotid bruits LYMPHATICS: No lymphadenopathy CARDIAC: ***RRR, no murmurs, rubs, gallops RESPIRATORY:  Clear to auscultation without rales, wheezing or rhonchi  ABDOMEN: Soft, non-tender, non-distended MUSCULOSKELETAL:  No edema; No deformity  SKIN: Warm and dry NEUROLOGIC:  Alert and oriented x 3 PSYCHIATRIC:  Normal affect    Signed, Norman Herrlich, MD  09/17/2020 1:37 PM     [facility]

## 2020-09-18 ENCOUNTER — Ambulatory Visit: Payer: BC Managed Care – PPO | Admitting: Cardiology

## 2020-10-10 ENCOUNTER — Ambulatory Visit: Payer: BC Managed Care – PPO | Admitting: Cardiology

## 2020-10-11 ENCOUNTER — Encounter: Payer: Self-pay | Admitting: Cardiology

## 2020-10-11 ENCOUNTER — Other Ambulatory Visit: Payer: Self-pay

## 2020-10-11 ENCOUNTER — Ambulatory Visit (INDEPENDENT_AMBULATORY_CARE_PROVIDER_SITE_OTHER): Payer: BC Managed Care – PPO | Admitting: Cardiology

## 2020-10-11 VITALS — BP 168/92 | HR 84 | Ht 62.0 in | Wt 158.0 lb

## 2020-10-11 DIAGNOSIS — E7800 Pure hypercholesterolemia, unspecified: Secondary | ICD-10-CM

## 2020-10-11 DIAGNOSIS — I10 Essential (primary) hypertension: Secondary | ICD-10-CM | POA: Diagnosis not present

## 2020-10-11 DIAGNOSIS — R079 Chest pain, unspecified: Secondary | ICD-10-CM | POA: Diagnosis not present

## 2020-10-11 NOTE — Patient Instructions (Addendum)
Medication Instructions:  Your physician recommends that you continue on your current medications as directed. Please refer to the Current Medication list given to you today.  *If you need a refill on your cardiac medications before your next appointment, please call your pharmacy*   Lab Work: None If you have labs (blood work) drawn today and your tests are completely normal, you will receive your results only by: Marland Kitchen MyChart Message (if you have MyChart) OR . A paper copy in the mail If you have any lab test that is abnormal or we need to change your treatment, we will call you to review the results.   Testing/Procedures: Your physician has requested that you have a stress echocardiogram. For further information please visit [email or web address]   -No food or drink after midnight the night before.  -Hold all medications the morning of your test.  -Dress in comfortable clothing and walking shoes.  -Wear clothing that is easily accessible to the chest wall.  -An IV may need to be placed for some tests.      Follow-Up: At [facility], you and your health needs are our priority.  As part of our continuing mission to provide you with exceptional heart care, we have created designated Provider Care Teams.  These Care Teams include your primary Cardiologist (physician) and Advanced Practice Providers (APPs -  Physician Assistants and Nurse Practitioners) who all work together to provide you with the care you need, when you need it.  We recommend signing up for the patient portal called "MyChart".  Sign up information is provided on this After Visit Summary.  MyChart is used to connect with patients for Virtual Visits (Telemedicine).  Patients are able to view lab/test results, encounter notes, upcoming appointments, etc.  Non-urgent messages can be sent to your provider as well.   To learn more about what you can do with MyChart, go to [email or web address].    Your next appointment:    6 week(s)  The format for your next appointment:   In Person  Provider:   Norman Herrlich, MD   Other Instructions

## 2020-10-11 NOTE — Progress Notes (Signed)
Cardiology Office Note:    Date:  10/11/2020   ID:  Lori Sparks, DOB 1961/04/03, MRN [id number]  PCP:  Joycelyn Rua, MD  Cardiologist:  Norman Herrlich, MD    Referring MD: Joycelyn Rua, MD    ASSESSMENT:    1. Chest pain of uncertain etiology   2. Essential hypertension   3. Pure hypercholesterolemia    PLAN:    In order of problems listed above:  1. Stable no recurrence she has obvious familial hyperlipidemia numbers and needs undergo an ischemia evaluation and will plan a stress echocardiogram. 2. Stable BP at target repeat by me in the office 138/90 continue to monitor at home and continue her beta-blocker 3. Continue statin she will need a lipid profile next visit   Next appointment: 6 weeks after stress echo   Medication Adjustments/Labs and Tests Ordered: Current medicines are reviewed at length with the patient today.  Concerns regarding medicines are outlined above.  Orders Placed This Encounter  Procedures  . ECHOCARDIOGRAM STRESS TEST   No orders of the defined types were placed in this encounter.   Chief Complaint  Patient presents with  . Follow-up    She was scheduled for a cardiac CTA last visit it has not been performed  . Hypertension    History of Present Illness:    Lori Sparks is a 60 y.o. female with a hx of palpitation hypertension and hyperlipidemia last seen 06/19/2020. Compliance with diet, lifestyle and medications: Yes  This became a complicated visit.  Is not well documented but she tells me she came for cardiac CTA they were unable to get venous access but also she was not instructed to take her beta-blocker had a time and she went home and has been awaiting a phone call to reschedule.  She is frustrated and ask if there is other test she can have done and I think of stress echo with normal at a high workload would provide the reassurance were looking for and will choose that is her first test. She continues a  high intensity statin the next visit we will check a lipid profile she has no muscle pain or weakness. Home blood pressure in range running 130-140/80. Past Medical History:  Diagnosis Date  . Meniere disease     Past Surgical History:  Procedure Laterality Date  . CESAREAN SECTION      Current Medications: Current Meds  Medication Sig  . ALPRAZolam (XANAX) 0.5 MG tablet Take 0.5 mg by mouth as needed.   . metoprolol succinate (TOPROL-XL) 25 MG 24 hr tablet Take 1 tablet (25 mg total) by mouth daily.  . rosuvastatin (CRESTOR) 10 MG tablet Take 1 tablet (10 mg total) by mouth daily.  . sertraline (ZOLOFT) 100 MG tablet Take 100 mg by mouth daily.     Allergies:   Patient has no known allergies.   Social History   Socioeconomic History  . Marital status: Married    Spouse name: Not on file  . Number of children: Not on file  . Years of education: Not on file  . Highest education level: Not on file  Occupational History  . Occupation: Runner, broadcasting/film/video  Tobacco Use  . Smoking status: Never Smoker  . Smokeless tobacco: Never Used  Vaping Use  . Vaping Use: Never used  Substance and Sexual Activity  . Alcohol use: Never  . Drug use: Not on file  . Sexual activity: Not on file  Other Topics Concern  . Not  on file  Social History Narrative  . Not on file   Social Determinants of Health   Financial Resource Strain: Not on file  Food Insecurity: Not on file  Transportation Needs: Not on file  Physical Activity: Not on file  Stress: Not on file  Social Connections: Not on file     Family History: The patient's family history includes Breast cancer in her maternal grandmother and sister. ROS:   Please see the history of present illness.    All other systems reviewed and are negative.  EKGs/Labs/Other Studies Reviewed:    The following studies were reviewed today:    Recent Labs: 11/10/2019: Cholesterol 308, LDL 172 triglycerides triglyceride 54 HDL 129 03/24/2020  creatinine 0.54  Physical Exam:    VS:  BP (!) 168/92 (BP Location: Left Arm)   Pulse 84   Ht 5\' 2"  (1.575 m)   Wt 158 lb (71.7 kg)   SpO2 97%   BMI 28.90 kg/m     Wt Readings from Last 3 Encounters:  10/11/20 158 lb (71.7 kg)  06/19/20 150 lb 0.6 oz (68.1 kg)  04/17/20 148 lb (67.1 kg)     GEN: She has no xanthoma or xanthelasma well nourished, well developed in no acute distress HEENT: Normal NECK: No JVD; No carotid bruits LYMPHATICS: No lymphadenopathy CARDIAC: RRR, no murmurs, rubs, gallops RESPIRATORY:  Clear to auscultation without rales, wheezing or rhonchi  ABDOMEN: Soft, non-tender, non-distended MUSCULOSKELETAL:  No edema; No deformity  SKIN: Warm and dry NEUROLOGIC:  Alert and oriented x 3 PSYCHIATRIC:  Normal affect    Signed, 06/18/20, MD  10/11/2020 5:04 PM    [facility]

## 2020-10-12 NOTE — Addendum Note (Signed)
Addended by: Norman Herrlich on: 10/12/2020 07:27 PM   Modules accepted: Orders

## 2020-11-22 ENCOUNTER — Telehealth (HOSPITAL_COMMUNITY): Payer: Self-pay | Admitting: *Deleted

## 2020-11-22 NOTE — Telephone Encounter (Signed)
Called patient to review stress test instructions, patient will call back at a later time for instructions.  Lori Sparks

## 2020-11-23 ENCOUNTER — Other Ambulatory Visit (HOSPITAL_COMMUNITY): Payer: BC Managed Care – PPO

## 2020-11-27 ENCOUNTER — Other Ambulatory Visit (HOSPITAL_COMMUNITY): Payer: BC Managed Care – PPO

## 2020-11-27 ENCOUNTER — Telehealth (HOSPITAL_COMMUNITY): Payer: Self-pay | Admitting: Cardiology

## 2020-11-27 NOTE — Telephone Encounter (Signed)
I called patient to reschedule Stress echocardiogram for 11/27/20 due to patient No SHOWED her COVID test.  I ha to leave a voicemail this morning at 8:42 to call the office.  We will reschedule COVID test and stress echo when she calls back.

## 2020-12-01 ENCOUNTER — Telehealth (HOSPITAL_COMMUNITY): Payer: Self-pay | Admitting: Cardiology

## 2020-12-01 NOTE — Telephone Encounter (Signed)
Just FYI: Patient is unable to schedule stress echocardiogram for reason below:  11/29/20 pt unable to schedule at this time with quarantine and missing work/LBW  11/28/20 [facility] to schedule @ 9:27/LBW  11/27/20 called pt due to she did not go to COVID test and had to LVm to call office regarding appt due to no DPR on file. Unable to leave deatiled message @ 8:30 LBW  10/17/20 [facility] to schedule @ 1:39/LBW   Order will be removed from the WQ and if patient calls back in the future we can reinstate the order or create a new one.      Thank you

## 2020-12-04 ENCOUNTER — Ambulatory Visit: Payer: BC Managed Care – PPO | Admitting: Cardiology

## 2021-01-02 NOTE — Progress Notes (Deleted)
Cardiology Office Note:    Date:  01/02/2021   ID:  Lori Sparks, DOB 08/03/1961, MRN [id number]  PCP:  Lori Rua, MD  Cardiologist:  Lori Herrlich, MD    Referring MD: Lori Rua, MD    ASSESSMENT:    No diagnosis found. PLAN:    In order of problems listed above:  1. ***   Next appointment: ***   Medication Adjustments/Labs and Tests Ordered: Current medicines are reviewed at length with the patient today.  Concerns regarding medicines are outlined above.  No orders of the defined types were placed in this encounter.  No orders of the defined types were placed in this encounter.   No chief complaint on file.   History of Present Illness:    Lori Sparks is a 60 y.o. female with a hx of hypertension hyperlipidemia and palpitation last seen 10/11/2020.  We had discussed an ischemia evaluation and she was planned for stress echocardiogram that has not been performed.. Compliance with diet, lifestyle and medications: *** Past Medical History:  Diagnosis Date  . Meniere disease     Past Surgical History:  Procedure Laterality Date  . CESAREAN SECTION      Current Medications: No outpatient medications have been marked as taking for the 01/03/21 encounter (Appointment) with Lori Daub, MD.     Allergies:   Patient has no known allergies.   Social History   Socioeconomic History  . Marital status: Married    Spouse name: Not on file  . Number of children: Not on file  . Years of education: Not on file  . Highest education level: Not on file  Occupational History  . Occupation: Runner, broadcasting/film/video  Tobacco Use  . Smoking status: Never Smoker  . Smokeless tobacco: Never Used  Vaping Use  . Vaping Use: Never used  Substance and Sexual Activity  . Alcohol use: Never  . Drug use: Not on file  . Sexual activity: Not on file  Other Topics Concern  . Not on file  Social History Narrative  . Not on file   Social Determinants of  Health   Financial Resource Strain: Not on file  Food Insecurity: Not on file  Transportation Needs: Not on file  Physical Activity: Not on file  Stress: Not on file  Social Connections: Not on file     Family History: The patient's ***family history includes Breast cancer in her maternal grandmother and sister. ROS:   Please see the history of present illness.    All other systems reviewed and are negative.  EKGs/Labs/Other Studies Reviewed:    The following studies were reviewed today:  EKG:  EKG ordered today and personally reviewed.  The ekg ordered today demonstrates ***  Recent Labs: No results found for requested labs within last 8760 hours.  Recent Lipid Panel No results found for: CHOL, TRIG, HDL, CHOLHDL, VLDL, LDLCALC, LDLDIRECT  Physical Exam:    VS:  There were no vitals taken for this visit.    Wt Readings from Last 3 Encounters:  10/11/20 158 lb (71.7 kg)  06/19/20 150 lb 0.6 oz (68.1 kg)  04/17/20 148 lb (67.1 kg)     GEN: *** Well nourished, well developed in no acute distress HEENT: Normal NECK: No JVD; No carotid bruits LYMPHATICS: No lymphadenopathy CARDIAC: ***RRR, no murmurs, rubs, gallops RESPIRATORY:  Clear to auscultation without rales, wheezing or rhonchi  ABDOMEN: Soft, non-tender, non-distended MUSCULOSKELETAL:  No edema; No deformity  SKIN: Warm and dry NEUROLOGIC:  Alert and oriented x 3 PSYCHIATRIC:  Normal affect    Signed, Lori Herrlich, MD  01/02/2021 1:09 PM    [facility]

## 2021-01-03 ENCOUNTER — Ambulatory Visit: Payer: BC Managed Care – PPO | Admitting: Cardiology

## 2021-01-19 ENCOUNTER — Ambulatory Visit: Payer: BC Managed Care – PPO | Admitting: Cardiology

## 2021-03-28 ENCOUNTER — Other Ambulatory Visit: Payer: Self-pay | Admitting: Family Medicine

## 2021-03-28 DIAGNOSIS — Z1231 Encounter for screening mammogram for malignant neoplasm of breast: Secondary | ICD-10-CM

## 2021-04-05 ENCOUNTER — Ambulatory Visit
Admission: RE | Admit: 2021-04-05 | Discharge: 2021-04-05 | Disposition: A | Discharge status: Home / Self Care | Payer: BC Managed Care – PPO | Source: Ambulatory Visit | Attending: Family Medicine | Admitting: Family Medicine

## 2021-04-05 ENCOUNTER — Other Ambulatory Visit: Payer: Self-pay

## 2021-04-05 DIAGNOSIS — Z1231 Encounter for screening mammogram for malignant neoplasm of breast: Secondary | ICD-10-CM

## 2021-05-15 ENCOUNTER — Telehealth (HOSPITAL_COMMUNITY): Payer: Self-pay | Admitting: *Deleted

## 2021-05-15 NOTE — Telephone Encounter (Signed)
Patient given detailed instructions per Stress Test Requisition Sheet for test on 05/21/21 at 2:00.Patient Notified to arrive 30 minutes early, and that it is imperative to arrive on time for appointment to keep from having the test rescheduled.  Patient verbalized understanding. Daneil Dolin

## 2021-05-18 NOTE — Telephone Encounter (Signed)
   Pt would like to get a cb from [place] again regarding her upcoming stress echo

## 2021-05-21 ENCOUNTER — Other Ambulatory Visit: Payer: Self-pay

## 2021-05-21 ENCOUNTER — Ambulatory Visit (HOSPITAL_COMMUNITY): Payer: BC Managed Care – PPO | Attending: Internal Medicine

## 2021-05-21 ENCOUNTER — Ambulatory Visit (HOSPITAL_BASED_OUTPATIENT_CLINIC_OR_DEPARTMENT_OTHER): Payer: BC Managed Care – PPO

## 2021-05-21 DIAGNOSIS — R079 Chest pain, unspecified: Secondary | ICD-10-CM | POA: Insufficient documentation

## 2021-05-21 MED ORDER — PERFLUTREN LIPID MICROSPHERE
1.0000 mL | INTRAVENOUS | Status: AC | PRN
  Administered 2021-05-21 (×3): 3 mL via INTRAVENOUS

## 2021-05-22 ENCOUNTER — Telehealth: Payer: Self-pay | Admitting: *Deleted

## 2021-05-22 ENCOUNTER — Telehealth: Payer: Self-pay | Admitting: Cardiology

## 2021-05-22 LAB — ECHOCARDIOGRAM STRESS TEST
Area-P 1/2: 3.61 cm2
S' Lateral: 2.3 cm

## 2021-05-22 NOTE — Telephone Encounter (Signed)
Patient is returning call to discuss echo stress test results.

## 2021-05-22 NOTE — Telephone Encounter (Signed)
-----   Message from Thomasene Ripple, DO sent at 05/22/2021 12:07 PM EDT ----- Is Dr. Hulen Shouts patient: Stress test normal

## 2021-05-22 NOTE — Telephone Encounter (Signed)
Message Received: Today Tobb, Kardie, DO  P Cv Div Ash/Hp Triage Is Dr. Hulen Shouts patient: Stress test normal    The patient has been notified of the result and verbalized understanding.  All questions (if any) were answered.   Pt did want to ask Dr. Dulce Sellar and RN when should she come back into the office to see him for follow-up.  Noted initially when Dr. Dulce Sellar saw the pt in clinic on 1/5, he advised for her to follow-up in 6 weeks after stress echo was done.  Informed the pt of this and informed her that I will still route this message to both Dr. Dulce Sellar and his RN for further advisement on follow-up. Informed the pt that Dr. Hulen Shouts RN will follow-up with her accordingly thereafter.  Pt verbalized understanding and agrees with this plan.

## 2021-05-22 NOTE — Telephone Encounter (Signed)
Pt is returning a call  

## 2021-05-22 NOTE — Telephone Encounter (Signed)
The patient has been notified of the result and verbalized understanding.  All questions (if any) were answered. Loa Socks, LPN 4/92/0100 7:12 PM

## 2021-05-22 NOTE — Telephone Encounter (Signed)
Left message on patients voicemail to please return our call.   

## 2021-05-25 NOTE — Telephone Encounter (Signed)
Left message on patients voicemail to please return our call.   

## 2021-05-25 NOTE — Telephone Encounter (Signed)
Spoke to the patient just now and let her know Dr. Munley's recommendations. She verbalizes understanding and thanks me for the call back.  

## 2021-06-21 ENCOUNTER — Other Ambulatory Visit: Payer: Self-pay | Admitting: Cardiology

## 2021-08-04 ENCOUNTER — Other Ambulatory Visit: Payer: Self-pay | Admitting: Cardiology

## 2021-08-28 IMAGING — MG DIGITAL SCREENING BILAT W/ CAD
4 series · 4 of 4 positions shown · non-contrast
Comparison: Previous exam(s).

CLINICAL DATA: Screening.

EXAM:
DIGITAL SCREENING BILATERAL MAMMOGRAM WITH CAD
TECHNIQUE: Bilateral screening digital craniocaudal and mediolateral oblique
mammograms were obtained. The images were evaluated with
computer-aided detection.

[L CC]
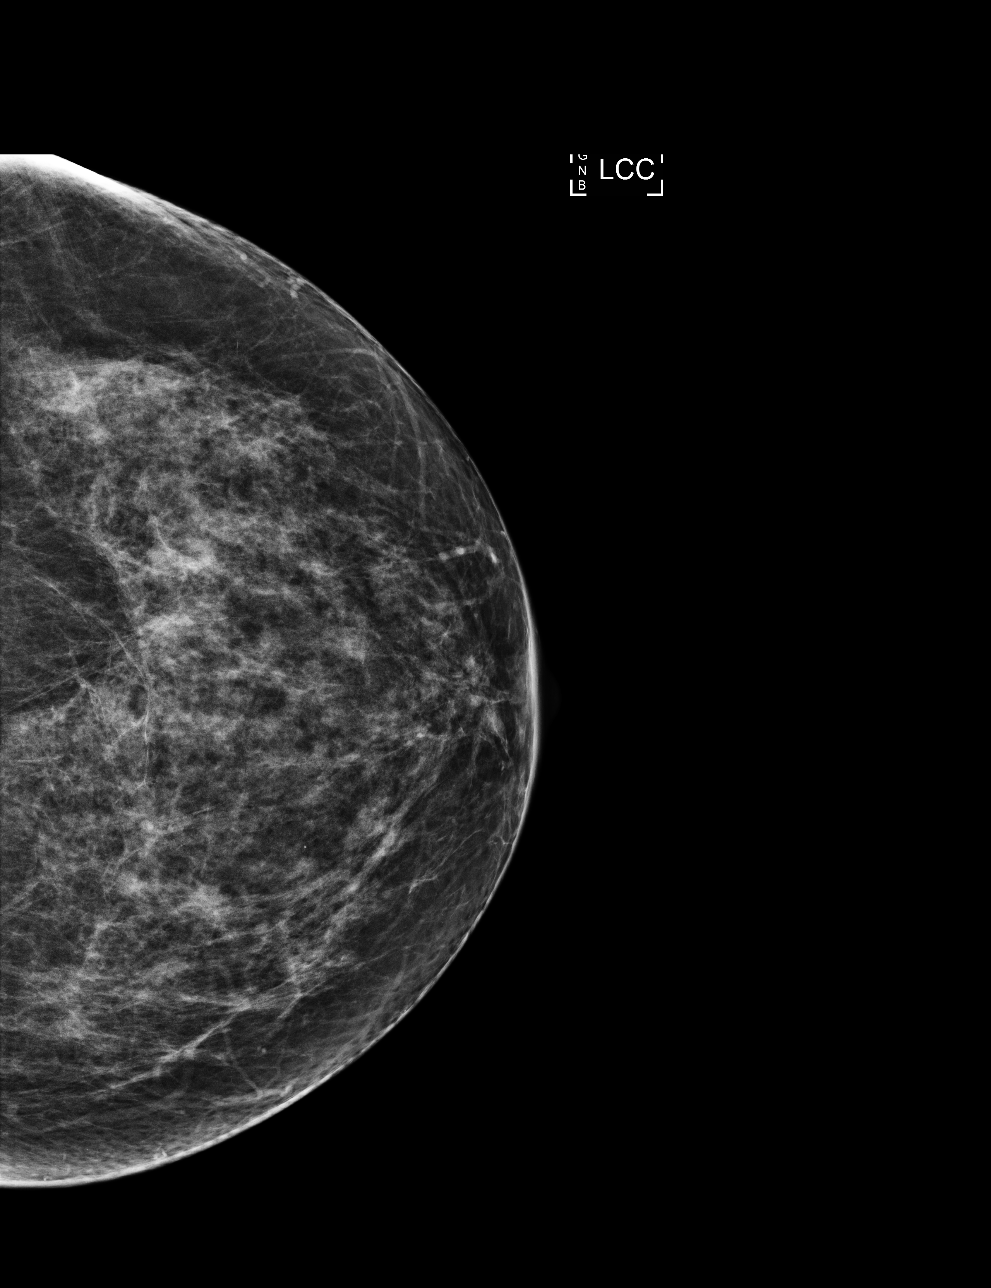

[R MLO]
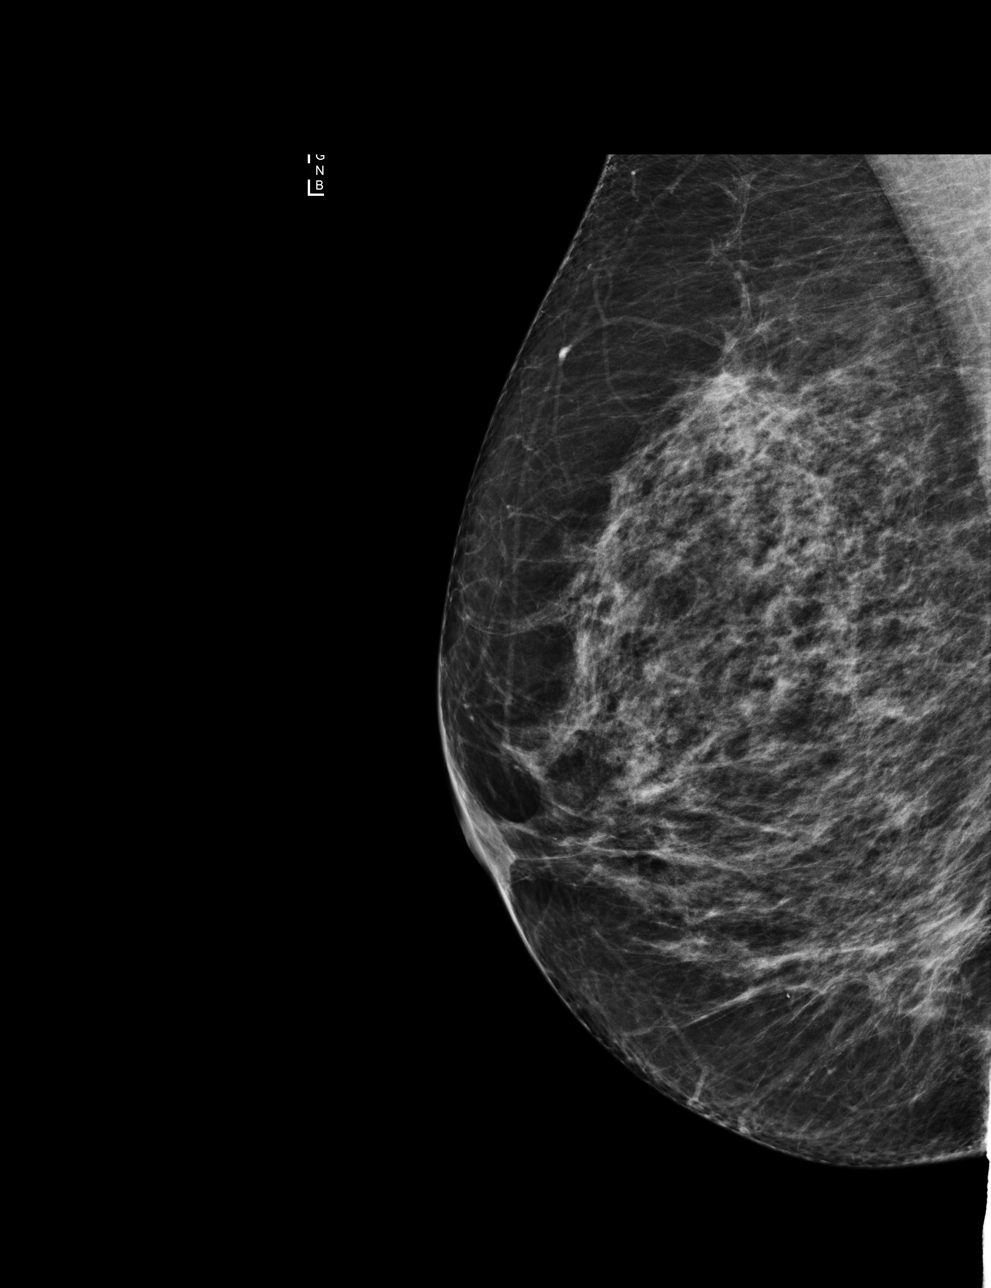

[R CC]
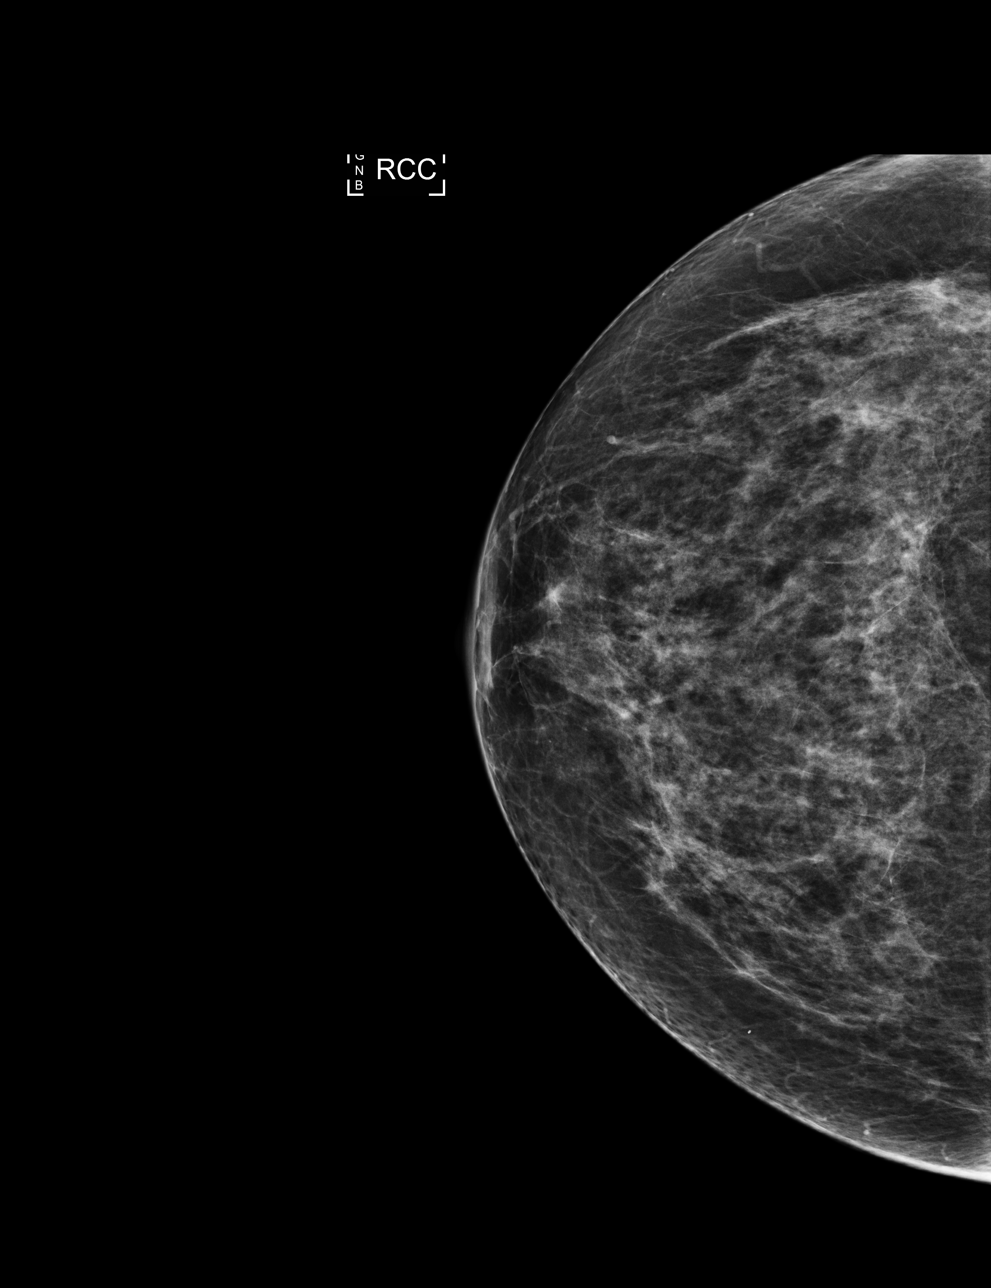

[L MLO]
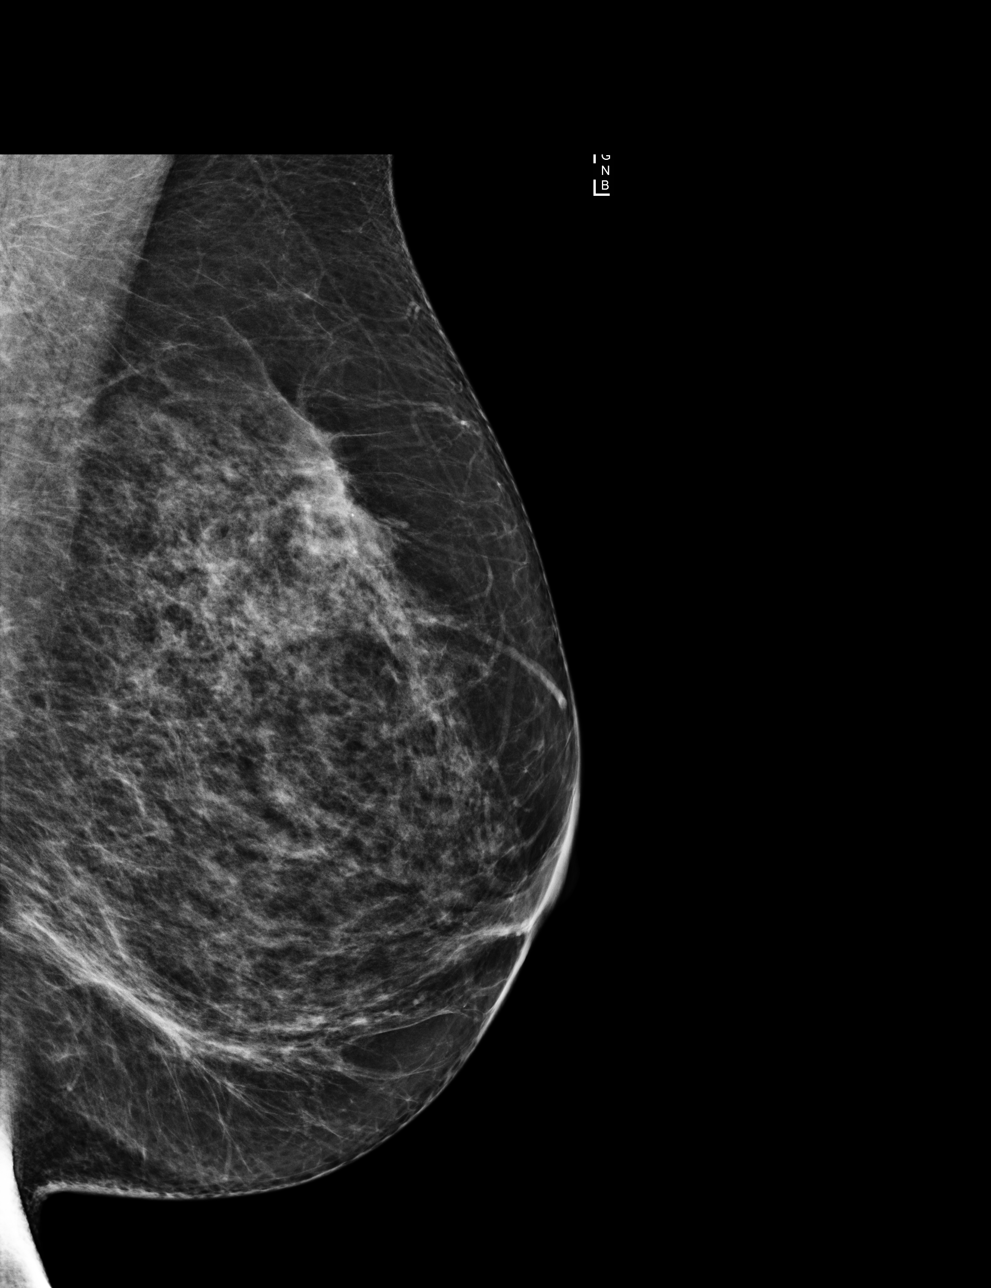

[4 of 4 positions shown; findings below may reference images not displayed]

ACR Breast Density Category b: There are scattered areas of
fibroglandular density.
FINDINGS: There are no findings suspicious for malignancy.
IMPRESSION: No mammographic evidence of malignancy. A result letter of this
screening mammogram will be mailed directly to the patient.

RECOMMENDATION:
Screening mammogram in one year. (Code:[id number])

BI-RADS CATEGORY  1: Negative.

## 2021-10-15 ENCOUNTER — Other Ambulatory Visit: Payer: Self-pay | Admitting: Cardiology

## 2022-02-03 ENCOUNTER — Other Ambulatory Visit: Payer: Self-pay | Admitting: Cardiology

## 2022-02-04 NOTE — Telephone Encounter (Signed)
Metoprolol Succinate ER 25 mg # 90 only with message for patient needs appointment for future refills / 1st attempt ?

## 2022-04-02 ENCOUNTER — Other Ambulatory Visit: Payer: Self-pay | Admitting: Family Medicine

## 2022-04-02 DIAGNOSIS — Z1231 Encounter for screening mammogram for malignant neoplasm of breast: Secondary | ICD-10-CM

## 2022-04-12 ENCOUNTER — Ambulatory Visit
Admission: RE | Admit: 2022-04-12 | Discharge: 2022-04-12 | Disposition: A | Discharge status: Home / Self Care | Payer: BC Managed Care – PPO | Source: Ambulatory Visit | Attending: Family Medicine | Admitting: Family Medicine

## 2022-04-12 DIAGNOSIS — Z1231 Encounter for screening mammogram for malignant neoplasm of breast: Secondary | ICD-10-CM

## 2022-05-10 ENCOUNTER — Other Ambulatory Visit: Payer: Self-pay | Admitting: Cardiology

## 2022-06-10 ENCOUNTER — Other Ambulatory Visit: Payer: Self-pay | Admitting: Cardiology

## 2022-06-13 MED ORDER — METOPROLOL SUCCINATE ER 25 MG PO TB24: 25.0000 mg | ORAL_TABLET | Freq: Every day | ORAL | 0 refills | Status: AC

## 2023-04-25 ENCOUNTER — Other Ambulatory Visit: Payer: Self-pay | Admitting: Family Medicine

## 2023-04-25 DIAGNOSIS — Z1231 Encounter for screening mammogram for malignant neoplasm of breast: Secondary | ICD-10-CM

## 2023-04-29 ENCOUNTER — Ambulatory Visit
Admission: RE | Admit: 2023-04-29 | Discharge: 2023-04-29 | Disposition: A | Discharge status: Home / Self Care | Payer: BC Managed Care – PPO | Source: Ambulatory Visit | Attending: Family Medicine | Admitting: Family Medicine

## 2023-04-29 DIAGNOSIS — Z1231 Encounter for screening mammogram for malignant neoplasm of breast: Secondary | ICD-10-CM

## 2023-07-14 ENCOUNTER — Telehealth: Payer: BC Managed Care – PPO | Admitting: Physician Assistant

## 2023-07-14 DIAGNOSIS — N760 Acute vaginitis: Secondary | ICD-10-CM

## 2023-07-14 DIAGNOSIS — B9689 Other specified bacterial agents as the cause of diseases classified elsewhere: Secondary | ICD-10-CM | POA: Diagnosis not present

## 2023-07-14 DIAGNOSIS — B3731 Acute candidiasis of vulva and vagina: Secondary | ICD-10-CM | POA: Diagnosis not present

## 2023-07-14 MED ORDER — FLUCONAZOLE 150 MG PO TABS: 150.0000 mg | ORAL_TABLET | ORAL | 0 refills | Status: DC | PRN

## 2023-07-14 MED ORDER — METRONIDAZOLE 500 MG PO TABS: 500.0000 mg | ORAL_TABLET | Freq: Two times a day (BID) | ORAL | 0 refills | Status: AC

## 2023-07-14 NOTE — Progress Notes (Signed)
E-Visit for Vaginal Symptoms  We are sorry that you are not feeling well. Here is how we plan to help! Based on what you shared with me it looks like you: May have a vaginosis due to bacteria  Vaginosis is an inflammation of the vagina that can result in discharge, itching and pain. The cause is usually a change in the normal balance of vaginal bacteria or an infection. Vaginosis can also result from reduced estrogen levels after menopause.  The most common causes of vaginosis are:   Bacterial vaginosis which results from an overgrowth of one on several organisms that are normally present in your vagina.   Yeast infections which are caused by a naturally occurring fungus called candida.   Vaginal atrophy (atrophic vaginosis) which results from the thinning of the vagina from reduced estrogen levels after menopause.   Trichomoniasis which is caused by a parasite and is commonly transmitted by sexual intercourse.  Factors that increase your risk of developing vaginosis include: Medications, such as antibiotics and steroids Uncontrolled diabetes Use of hygiene products such as bubble bath, vaginal spray or vaginal deodorant Douching Wearing damp or tight-fitting clothing Using an intrauterine device (IUD) for birth control Hormonal changes, such as those associated with pregnancy, birth control pills or menopause Sexual activity Having a sexually transmitted infection  Your treatment plan is Metronidazole or Flagyl 500mg  twice a day for 7 days.  I have electronically sent this prescription into the pharmacy that you have chosen. I have also sent Fluconazole for the itching for possible yeast infection as well. Take one today and one once the antibiotic is completed.  Be sure to take all of the medication as directed. Stop taking any medication if you develop a rash, tongue swelling or shortness of breath. Mothers who are breast feeding should consider pumping and discarding their breast  milk while on these antibiotics. However, there is no consensus that infant exposure at these doses would be harmful.  Remember that medication creams can weaken latex condoms. Marland Kitchen   HOME CARE:  Good hygiene may prevent some types of vaginosis from recurring and may relieve some symptoms:  Avoid baths, hot tubs and whirlpool spas. Rinse soap from your outer genital area after a shower, and dry the area well to prevent irritation. Don't use scented or harsh soaps, such as those with deodorant or antibacterial action. Avoid irritants. These include scented tampons and pads. Wipe from front to back after using the toilet. Doing so avoids spreading fecal bacteria to your vagina.  Other things that may help prevent vaginosis include:  Don't douche. Your vagina doesn't require cleansing other than normal bathing. Repetitive douching disrupts the normal organisms that reside in the vagina and can actually increase your risk of vaginal infection. Douching won't clear up a vaginal infection. Use a latex condom. Both female and female latex condoms may help you avoid infections spread by sexual contact. Wear cotton underwear. Also wear pantyhose with a cotton crotch. If you feel comfortable without it, skip wearing underwear to bed. Yeast thrives in [employer] Your symptoms should improve in the next day or two.  GET HELP RIGHT AWAY IF:  You have pain in your lower abdomen ( pelvic area or over your ovaries) You develop nausea or vomiting You develop a fever Your discharge changes or worsens You have persistent pain with intercourse You develop shortness of breath, a rapid pulse, or you faint.  These symptoms could be signs of problems or infections that need to  be evaluated by a medical provider now.  MAKE SURE YOU   Understand these instructions. Will watch your condition. Will get help right away if you are not doing well or get worse.  Thank you for choosing an e-visit.  Your  e-visit answers were reviewed by a board certified advanced clinical practitioner to complete your personal care plan. Depending upon the condition, your plan could have included both over the counter or prescription medications.  Please review your pharmacy choice. Make sure the pharmacy is open so you can pick up prescription now. If there is a problem, you may contact your provider through [employer] and have the prescription routed to another pharmacy.  Your safety is important to [place]. If you have drug allergies check your prescription carefully.   For the next 24 hours you can use MyChart to ask questions about today's visit, request a non-urgent call back, or ask for a work or school excuse. You will get an email in the next two days asking about your experience. I hope that your e-visit has been valuable and will speed your recovery.   I have spent 5 minutes in review of e-visit questionnaire, review and updating patient chart, medical decision making and response to patient.   Margaretann Loveless, PA-C

## 2024-04-29 ENCOUNTER — Encounter: Admitting: Podiatry

## 2024-05-03 ENCOUNTER — Ambulatory Visit: Admitting: Podiatry

## 2024-05-03 ENCOUNTER — Ambulatory Visit (INDEPENDENT_AMBULATORY_CARE_PROVIDER_SITE_OTHER)

## 2024-05-03 ENCOUNTER — Encounter: Payer: Self-pay | Admitting: Podiatry

## 2024-05-03 VITALS — Ht 62.0 in | Wt 158.0 lb

## 2024-05-03 DIAGNOSIS — M722 Plantar fascial fibromatosis: Secondary | ICD-10-CM | POA: Diagnosis not present

## 2024-05-03 NOTE — Progress Notes (Signed)
 Subjective:   Patient ID: Lori Sparks, female   DOB: 63 y.o.   MRN: [id number]   HPI Patient presents with long-term problems with plantar fasciitis and arch pain left over right with approximate 6-year duration.  Patient has done immobilization has done anti-inflammatories injections physical therapy dry needling and other modalities without relief of her symptoms and it does not allow her to be as active as she wants.  Patient does not smoke likes to be active   Review of Systems  All other systems reviewed and are negative.       Objective:  Physical Exam Vitals and nursing note reviewed.  Constitutional:      Appearance: She is well-developed.  Pulmonary:     Effort: Pulmonary effort is normal.  Musculoskeletal:        General: Normal range of motion.  Skin:    General: Skin is warm.  Neurological:     Mental Status: She is alert.     Neurovascular status found to be intact muscle strength was found to be adequate range of motion adequate with discomfort in the plantar fascia left over right with inflammation fluid along the medial band.  Patient is noted to have good digital perfusion well oriented x 3 with mild equinus     Assessment:  Chronic plantar fasciitis left over right 5 years duration which has not responded to numerous conservative treatments     Plan:  H&P x-rays reviewed and we discussed different treatment options and I do not see conservative is being of benefit due to the long-term history of how many different things she has had.  At this point I went ahead and I have recommended shockwave therapy and we discussed this.  I do think it needs to be done throughout her arch on the plantar fascia and extending into the heel and also loosening up the calf muscle.  This will be done starting in the next couple weeks and today I dispensed an air fracture walker that I want her to wear during the recovery.  I did discuss at 1 point surgery may be  necessary if she does not respond to shockwave therapy

## 2024-05-04 ENCOUNTER — Other Ambulatory Visit: Payer: Self-pay | Admitting: Family Medicine

## 2024-05-04 DIAGNOSIS — Z1231 Encounter for screening mammogram for malignant neoplasm of breast: Secondary | ICD-10-CM

## 2024-05-13 ENCOUNTER — Ambulatory Visit
Admission: RE | Admit: 2024-05-13 | Discharge: 2024-05-13 | Disposition: A | Discharge status: Home / Self Care | Payer: Self-pay | Source: Ambulatory Visit | Attending: Family Medicine | Admitting: Family Medicine

## 2024-05-13 DIAGNOSIS — Z1231 Encounter for screening mammogram for malignant neoplasm of breast: Secondary | ICD-10-CM

## 2024-06-08 ENCOUNTER — Ambulatory Visit (INDEPENDENT_AMBULATORY_CARE_PROVIDER_SITE_OTHER): Payer: Self-pay

## 2024-06-08 DIAGNOSIS — M722 Plantar fascial fibromatosis: Secondary | ICD-10-CM

## 2024-06-08 NOTE — Progress Notes (Signed)
 Patient presents for the 1st EPAT treatment today with complaint of left heel pain. Diagnosed with plantar fascitis by Dr. Magdalen. This has been ongoing for several months. The patient has tried ice, stretching, NSAIDS and supportive shoe gear with no long term relief.   Most of the pain is located in the left heel and arch area .  ESWT administered and tolerated well.Treatment settings initiated at:   Energy: 0.15  Ended treatment session today with 3000 shocks at the following settings:   Energy: 0.20  Frequency: 5.0 hz  Reviewed post EPAT instructions. Advised to avoid ice and NSAIDs throughout the treatment process and to utilize boot or supportive shoes for at least the next 3 days.  Follow up for 2nd treatment in 1 week.

## 2024-06-08 NOTE — Patient Instructions (Signed)
 Sound (Acoustic) waves compress/release tissue with each "tick" you hear. In layman's terms, this signals your body to increase "healing" cells and proteins (called growth factors) to the area. It also promotes increased blood flow and creation of new small blood vessels to "feed" the area. Lastly, it can "move out" damaged tissue cells and fluid making way for "fresh" cells and tissue. In essence, the goal is to "turn over" unhealthy tissue to healthy tissue and regenerate in areas of injury or damage.  Avoid anti-inflammatory medications such as Advil, Motrin and Aleve, Tylenol is permitted is you need something for pain relief. Please only take Tylenol if you can. If you are taking anti-inflammatory medications for other medical conditions, please continue taking those as prescribed by your doctor. Avoid using ice or cold compresses. Wear supportive shoes or boot for the next 3 days after the treatment. Reduce high impact activities to 50% or less during treatments.   Flare-up pain is normal after treatment and could last up to 2 days. Adhere to above instructions to help with this. Pain should become less sharp over time during your treatments. You may notice that you are able to stand for longer periods of time without pain. You may notice an increase in range of motion without pain. Try not to push yourself to increase activities until treatments are complete, no matter how good your foot may feel.

## 2024-06-15 ENCOUNTER — Ambulatory Visit (INDEPENDENT_AMBULATORY_CARE_PROVIDER_SITE_OTHER)

## 2024-06-15 DIAGNOSIS — M722 Plantar fascial fibromatosis: Secondary | ICD-10-CM

## 2024-06-15 NOTE — Progress Notes (Signed)
 Patient presents for the 2nd EPAT treatment today with complaint of left heel pain and arch pain. Diagnosed with plantar fascitis by Dr. Magdalen. This has been ongoing for several months. The patient has tried ice, stretching, NSAIDS and supportive shoe gear with no long term relief.  She did wear cam boot for 3 days post treatment last week and had minimal pain, did some extended standing on Friday and deeloped increased pain over the weekend. She will remember to limit activity after today's treatment. She is going to [place] this weekend to visit her younger daughter.   Most of the pain is located in left heel and arch .  ESWT administered and tolerated well.Treatment settings initiated at:   Energy: 0.20  Ended treatment session today with 3000 shocks at the following settings:   Energy: 0.30  Frequency: 4.0   Reviewed post EPAT instructions. Advised to avoid ice and NSAIDs throughout the treatment process and to utilize boot or supportive shoes for at least the next 3 days.  Follow up for 3rd treatment in 1 week.

## 2024-06-22 ENCOUNTER — Ambulatory Visit (INDEPENDENT_AMBULATORY_CARE_PROVIDER_SITE_OTHER): Payer: Self-pay

## 2024-06-22 DIAGNOSIS — M722 Plantar fascial fibromatosis: Secondary | ICD-10-CM

## 2024-06-22 NOTE — Progress Notes (Signed)
 Patient presents for the 3rd EPAT treatment today with complaint of left heel pain and arch pain. Diagnosed with plantar fascitis by Dr. magdalen. This has been ongoing for several months. The patient has tried ice, stretching, NSAIDS and supportive shoe gear with no long term relief. She reports some improvement while wearing cam boot and for a few days after being out of cam boot. Has had some intermittent pain at night in the top of the foot. Some intermittent pain first thing in the mornings as well. The left foot is not popping like it was before.   Most of the pain is located in left heel and arch as well as the lateral side of foot .  ESWT administered and tolerated well.Treatment settings initiated at:   Energy: 0.30  Ended treatment session today with 3000 shocks at the following settings:   Energy: 0.40  Frequency: 3.0   Reviewed post EPAT instructions. Advised to avoid ice and NSAIDs throughout the treatment process and to utilize boot or supportive shoes for at least the next 3 days.  Follow up for 4th treatment in 2 weeks.

## 2024-07-05 ENCOUNTER — Telehealth: Payer: Self-pay | Admitting: Lab

## 2024-07-05 NOTE — Telephone Encounter (Signed)
 Patient calling concerned about pain that has developed on top of her foot after starting EPAP treatments has never had pain on top of her foot and is very concerned wants a call back with advise from provider.

## 2024-07-05 NOTE — Telephone Encounter (Signed)
 That is not coming from the epat and if it is persisting she should come in for evaluation

## 2024-07-05 NOTE — Telephone Encounter (Signed)
 Spoke to patient advised.

## 2024-07-08 ENCOUNTER — Telehealth: Payer: Self-pay | Admitting: Podiatry

## 2024-07-08 NOTE — Telephone Encounter (Signed)
 Pt was advise to Patient was advised to schedule an evaluation due to pain developing on top of her foot after starting EPAP treatments, with worsening swelling. She has been scheduled for 10/06 but would like to know if she should keep her next scheduled EPAP appointment on 10/03.make appt for eval: pain that has developed on top of her foot after starting EPAP treatments and the swelling is getting worst. Have her sch'ed for 10/06 however she wants to know should she keep her next sch'ed appt for EPAT on 10/03 and/or is she can see you tomorrow.  Please advise.

## 2024-07-08 NOTE — Telephone Encounter (Signed)
 thanks

## 2024-07-08 NOTE — Telephone Encounter (Signed)
 Thank you, Nikki. The patient should hold off on this final EPAT until seen by provider. Can you please call her and let her know. I can cancel the appt once she has been notified.

## 2024-07-09 ENCOUNTER — Ambulatory Visit (INDEPENDENT_AMBULATORY_CARE_PROVIDER_SITE_OTHER)

## 2024-07-09 ENCOUNTER — Encounter: Payer: Self-pay | Admitting: Podiatry

## 2024-07-09 ENCOUNTER — Ambulatory Visit: Admitting: Podiatry

## 2024-07-09 ENCOUNTER — Ambulatory Visit

## 2024-07-09 DIAGNOSIS — M722 Plantar fascial fibromatosis: Secondary | ICD-10-CM

## 2024-07-09 DIAGNOSIS — M779 Enthesopathy, unspecified: Secondary | ICD-10-CM

## 2024-07-09 MED ORDER — TRIAMCINOLONE ACETONIDE 10 MG/ML IJ SUSP
10.0000 mg | Freq: Once | INTRAMUSCULAR | Status: AC
  Administered 2024-07-09: 10 mg via INTRA_ARTICULAR

## 2024-07-09 NOTE — Patient Instructions (Signed)
 EPAT treatment has virtually no risks or side effects. In some cases, patients may experience some minor discomfort which may continue for a few days. It is normal to have some residual pain after intense exercise or a full day of work.The beneficial effects of Extracorporeal Pulse Activation Technology (EPAT) are often experienced after only 3 treatments. Some patients report immediate pain relief after the treatment, although it can take up to 4 weeks for pain relief to begin. The procedure eliminates pain and restores full mobility, thus improving your quality of life. Over 80% of patients treated report to be pain free and/or have significant pain reduction.

## 2024-07-12 ENCOUNTER — Ambulatory Visit: Admitting: Podiatry

## 2024-07-12 NOTE — Progress Notes (Signed)
 Subjective:   Patient ID: Lori Sparks, female   DOB: 63 y.o.   MRN: [id number]   HPI Patient states it seems that the heel is doing somewhat better with shockwave therapy but she has developed quite a bit of pain on the dorsum of the left foot and states that is been going on for the last few weeks   ROS      Objective:  Physical Exam  Neurovascular status intact with the patient's heel what appears to be mildly improved difficult to make complete determination with discomfort on the dorsal lateral aspect of the left foot around the peroneal tertius group     Assessment:  Probability for compensatory peroneal tendinitis which may be due to improvement of gait process with improvement of heel and arch     Plan:  H&P reviewed at this point careful injection of the dorsal tendon lateral complex 3 mg dexamethasone Kenalog 5 mg Xylocaine continue with 1 more shockwave and will be seen back as symptoms indicate.  X-ray dated today was negative for signs that this is a stress fracture or an apparent bony injury

## 2024-07-16 ENCOUNTER — Ambulatory Visit: Admitting: Podiatry

## 2024-08-20 ENCOUNTER — Ambulatory Visit (INDEPENDENT_AMBULATORY_CARE_PROVIDER_SITE_OTHER): Payer: Self-pay

## 2024-08-20 DIAGNOSIS — M722 Plantar fascial fibromatosis: Secondary | ICD-10-CM

## 2024-08-20 NOTE — Progress Notes (Signed)
 Patient presents for the 4th EPAT  treatment today with complaint of left arch and heel pain. Diagnosed with plantar fascitis by Dr. Magdalen. This has been ongoing for several months. The patient has tried ice, stretching, NSAIDS and supportive shoe gear with no long term relief.   Most of the pain is located lateral aspect foot and into her toes .  ESWT administered and tolerated well.Treatment settings initiated at:   Energy: 0.40  Ended treatment session today with 3000 shocks at the following settings:   Energy: 0.40    Frequency: 3.0    Reviewed post EPAT instructions. Advised to avoid ice and NSAIDs throughout the treatment process and to utilize boot or supportive shoes for at least the next 3 days.  Follow up with Dr. Magdalen in 2 weeks.

## 2024-09-06 ENCOUNTER — Encounter: Payer: Self-pay | Admitting: Podiatry

## 2024-09-06 ENCOUNTER — Ambulatory Visit: Admitting: Podiatry

## 2024-09-06 DIAGNOSIS — M722 Plantar fascial fibromatosis: Secondary | ICD-10-CM

## 2024-09-06 NOTE — Progress Notes (Signed)
 Subjective:   Patient ID: Lori Sparks, female   DOB: 63 y.o.   MRN: [id number]   HPI Patient states she is about 50% better but has pain  in her arch in her forefoot left and states that she is just concerned because she cannot do the exercising that she wants to do   ROS      Objective:  Physical Exam  Neurovascular status intact with the patient noted to have moderate discomfort in the left arch mild into the left forefoot with swelling noted but no other pathology with good digital perfusion well-oriented x 3     Assessment:  Inflammatory condition capsular fluid buildup with the probability that this is still compensation but cannot rule out other causes F2 H&P reviewed her different shoe gear choices and exercises to do and anti-inflammatories to take and at this point dispensed night splint with all instructions on usage properly fitted to the lower leg and I advised on heat ice therapy to try to keep remaining symptoms under control.  May require other treatments if symptoms persist but so far shockwave has been very beneficial in treating the heel discomfort     Plan:  Above mentions the plan for this patient

## 2024-09-08 ENCOUNTER — Encounter: Payer: Self-pay | Admitting: Podiatry

## 2024-12-06 ENCOUNTER — Ambulatory Visit: Admitting: Podiatry
